# Patient Record
Sex: Female | Born: 1965
Health system: Southern US, Community
[De-identification: ages and names within clinical notes are randomized; demographics above are authoritative.]

## PROBLEM LIST (undated history)

## (undated) DIAGNOSIS — G43909 Migraine, unspecified, not intractable, without status migrainosus: Secondary | ICD-10-CM

## (undated) DIAGNOSIS — E785 Hyperlipidemia, unspecified: Secondary | ICD-10-CM

## (undated) DIAGNOSIS — N6019 Diffuse cystic mastopathy of unspecified breast: Secondary | ICD-10-CM

## (undated) DIAGNOSIS — D649 Anemia, unspecified: Secondary | ICD-10-CM

## (undated) DIAGNOSIS — R110 Nausea: Secondary | ICD-10-CM

## (undated) DIAGNOSIS — H269 Unspecified cataract: Secondary | ICD-10-CM

## (undated) DIAGNOSIS — K219 Gastro-esophageal reflux disease without esophagitis: Secondary | ICD-10-CM

## (undated) DIAGNOSIS — N189 Chronic kidney disease, unspecified: Secondary | ICD-10-CM

## (undated) DIAGNOSIS — M1990 Unspecified osteoarthritis, unspecified site: Secondary | ICD-10-CM

## (undated) DIAGNOSIS — T7840XA Allergy, unspecified, initial encounter: Secondary | ICD-10-CM

## (undated) HISTORY — DX: Hyperlipidemia, unspecified: E78.5

## (undated) HISTORY — DX: Diffuse cystic mastopathy of unspecified breast: N60.19

## (undated) HISTORY — PX: WISDOM TOOTH EXTRACTION: SHX21

## (undated) HISTORY — DX: Anemia, unspecified: D64.9

## (undated) HISTORY — DX: Gastro-esophageal reflux disease without esophagitis: K21.9

## (undated) HISTORY — DX: Migraine, unspecified, not intractable, without status migrainosus: G43.909

## (undated) HISTORY — DX: Unspecified osteoarthritis, unspecified site: M19.90

## (undated) HISTORY — PX: COLONOSCOPY: SHX174

## (undated) HISTORY — DX: Unspecified cataract: H26.9

## (undated) HISTORY — DX: Chronic kidney disease, unspecified: N18.9

## (undated) HISTORY — DX: Allergy, unspecified, initial encounter: T78.40XA

## (undated) HISTORY — DX: Nausea: R11.0

---

## 1990-07-31 HISTORY — PX: TUBAL LIGATION: SHX77

## 1999-07-01 HISTORY — PX: BREAST LUMPECTOMY: SHX2

## 1999-07-08 ENCOUNTER — Ambulatory Visit (HOSPITAL_BASED_OUTPATIENT_CLINIC_OR_DEPARTMENT_OTHER): Admission: RE | Admit: 1999-07-08 | Discharge: 1999-07-08 | Payer: Self-pay | Admitting: *Deleted

## 1999-07-08 ENCOUNTER — Encounter (INDEPENDENT_AMBULATORY_CARE_PROVIDER_SITE_OTHER): Payer: Self-pay | Admitting: *Deleted

## 1999-08-01 HISTORY — PX: BREAST EXCISIONAL BIOPSY: SUR124

## 2001-09-26 ENCOUNTER — Other Ambulatory Visit: Admission: RE | Admit: 2001-09-26 | Discharge: 2001-09-26 | Payer: Self-pay | Admitting: Internal Medicine

## 2001-09-26 ENCOUNTER — Encounter: Payer: Self-pay | Admitting: Internal Medicine

## 2003-05-22 ENCOUNTER — Encounter: Admission: RE | Admit: 2003-05-22 | Discharge: 2003-05-22 | Payer: Self-pay | Admitting: Internal Medicine

## 2003-11-03 ENCOUNTER — Encounter: Admission: RE | Admit: 2003-11-03 | Discharge: 2003-11-03 | Payer: Self-pay | Admitting: Internal Medicine

## 2004-06-17 ENCOUNTER — Encounter: Admission: RE | Admit: 2004-06-17 | Discharge: 2004-06-17 | Payer: Self-pay | Admitting: Internal Medicine

## 2005-03-09 ENCOUNTER — Ambulatory Visit: Payer: Self-pay | Admitting: Internal Medicine

## 2005-03-10 ENCOUNTER — Encounter: Payer: Self-pay | Admitting: Internal Medicine

## 2005-07-10 ENCOUNTER — Ambulatory Visit: Payer: Self-pay | Admitting: Internal Medicine

## 2005-07-10 ENCOUNTER — Other Ambulatory Visit: Admission: RE | Admit: 2005-07-10 | Discharge: 2005-07-10 | Payer: Self-pay | Admitting: Internal Medicine

## 2005-09-04 ENCOUNTER — Encounter: Admission: RE | Admit: 2005-09-04 | Discharge: 2005-09-04 | Payer: Self-pay | Admitting: Internal Medicine

## 2005-09-09 ENCOUNTER — Emergency Department (HOSPITAL_COMMUNITY): Admission: EM | Admit: 2005-09-09 | Discharge: 2005-09-09 | Payer: Self-pay | Admitting: Emergency Medicine

## 2005-10-12 ENCOUNTER — Ambulatory Visit: Payer: Self-pay | Admitting: Internal Medicine

## 2006-08-16 ENCOUNTER — Ambulatory Visit: Payer: Self-pay | Admitting: Internal Medicine

## 2006-08-17 ENCOUNTER — Encounter: Payer: Self-pay | Admitting: Internal Medicine

## 2007-12-16 ENCOUNTER — Ambulatory Visit: Payer: Self-pay | Admitting: Internal Medicine

## 2007-12-16 DIAGNOSIS — J301 Allergic rhinitis due to pollen: Secondary | ICD-10-CM

## 2007-12-16 DIAGNOSIS — D649 Anemia, unspecified: Secondary | ICD-10-CM

## 2007-12-16 DIAGNOSIS — N6019 Diffuse cystic mastopathy of unspecified breast: Secondary | ICD-10-CM

## 2007-12-16 DIAGNOSIS — H9190 Unspecified hearing loss, unspecified ear: Secondary | ICD-10-CM | POA: Insufficient documentation

## 2007-12-16 DIAGNOSIS — K219 Gastro-esophageal reflux disease without esophagitis: Secondary | ICD-10-CM

## 2007-12-17 ENCOUNTER — Encounter: Payer: Self-pay | Admitting: Internal Medicine

## 2007-12-19 ENCOUNTER — Encounter: Payer: Self-pay | Admitting: Internal Medicine

## 2007-12-20 ENCOUNTER — Encounter (INDEPENDENT_AMBULATORY_CARE_PROVIDER_SITE_OTHER): Payer: Self-pay | Admitting: *Deleted

## 2007-12-24 ENCOUNTER — Encounter: Admission: RE | Admit: 2007-12-24 | Discharge: 2007-12-24 | Payer: Self-pay | Admitting: Internal Medicine

## 2007-12-24 ENCOUNTER — Telehealth (INDEPENDENT_AMBULATORY_CARE_PROVIDER_SITE_OTHER): Payer: Self-pay | Admitting: *Deleted

## 2007-12-26 ENCOUNTER — Encounter (INDEPENDENT_AMBULATORY_CARE_PROVIDER_SITE_OTHER): Payer: Self-pay | Admitting: *Deleted

## 2008-01-08 ENCOUNTER — Encounter: Payer: Self-pay | Admitting: Internal Medicine

## 2008-01-24 ENCOUNTER — Telehealth: Payer: Self-pay | Admitting: Family Medicine

## 2008-01-27 ENCOUNTER — Ambulatory Visit: Payer: Self-pay | Admitting: Family Medicine

## 2008-01-27 DIAGNOSIS — R030 Elevated blood-pressure reading, without diagnosis of hypertension: Secondary | ICD-10-CM | POA: Insufficient documentation

## 2008-02-05 ENCOUNTER — Telehealth: Payer: Self-pay | Admitting: Internal Medicine

## 2009-01-04 ENCOUNTER — Ambulatory Visit: Payer: Self-pay | Admitting: Internal Medicine

## 2009-01-04 DIAGNOSIS — E785 Hyperlipidemia, unspecified: Secondary | ICD-10-CM

## 2009-01-05 ENCOUNTER — Encounter: Payer: Self-pay | Admitting: Internal Medicine

## 2009-01-18 ENCOUNTER — Ambulatory Visit: Payer: Self-pay | Admitting: Family Medicine

## 2009-01-18 DIAGNOSIS — M25569 Pain in unspecified knee: Secondary | ICD-10-CM

## 2009-01-18 DIAGNOSIS — M629 Disorder of muscle, unspecified: Secondary | ICD-10-CM

## 2009-03-24 ENCOUNTER — Encounter: Admission: RE | Admit: 2009-03-24 | Discharge: 2009-03-24 | Payer: Self-pay | Admitting: Internal Medicine

## 2009-03-25 DIAGNOSIS — R928 Other abnormal and inconclusive findings on diagnostic imaging of breast: Secondary | ICD-10-CM | POA: Insufficient documentation

## 2009-03-29 ENCOUNTER — Encounter: Admission: RE | Admit: 2009-03-29 | Discharge: 2009-03-29 | Payer: Self-pay | Admitting: Internal Medicine

## 2009-09-13 ENCOUNTER — Ambulatory Visit: Payer: Self-pay | Admitting: Internal Medicine

## 2009-09-13 DIAGNOSIS — N943 Premenstrual tension syndrome: Secondary | ICD-10-CM | POA: Insufficient documentation

## 2009-09-16 ENCOUNTER — Encounter: Payer: Self-pay | Admitting: Internal Medicine

## 2009-12-06 ENCOUNTER — Ambulatory Visit: Payer: Self-pay | Admitting: Internal Medicine

## 2010-01-12 ENCOUNTER — Telehealth: Payer: Self-pay | Admitting: Internal Medicine

## 2010-02-14 ENCOUNTER — Ambulatory Visit: Payer: Self-pay | Admitting: Internal Medicine

## 2010-03-25 ENCOUNTER — Encounter: Admission: RE | Admit: 2010-03-25 | Discharge: 2010-03-25 | Payer: Self-pay | Admitting: Internal Medicine

## 2010-03-25 LAB — HM MAMMOGRAPHY: HM Mammogram: NEGATIVE

## 2010-03-29 ENCOUNTER — Encounter: Payer: Self-pay | Admitting: Internal Medicine

## 2010-05-11 ENCOUNTER — Encounter: Payer: Self-pay | Admitting: Internal Medicine

## 2010-05-11 ENCOUNTER — Ambulatory Visit: Payer: Self-pay | Admitting: Internal Medicine

## 2010-05-11 DIAGNOSIS — R609 Edema, unspecified: Secondary | ICD-10-CM

## 2010-05-11 DIAGNOSIS — R002 Palpitations: Secondary | ICD-10-CM | POA: Insufficient documentation

## 2010-08-22 ENCOUNTER — Encounter: Payer: Self-pay | Admitting: Internal Medicine

## 2010-08-30 NOTE — Assessment & Plan Note (Signed)
Summary: CPX/DLO   Vital Signs:  Patient profile:   45 year old female Height:      59.25 inches Weight:      136 pounds Temp:     98.3 degrees F oral Pulse rate:   72 / minute Pulse rhythm:   regular BP sitting:   120 / 80  (left arm) Cuff size:   regular  Vitals Entered By: Lowella Petties CMA (May 11, 2010 9:30 AM) CC: CPX- no pap   History of Present Illness: Doing well Takes the OCP for 3 cycles and then has fairly normal withdrawl bleeding  Having trouble with fluid retention All over---better after sleep (has nocturia) Notes some easier DOE----then she will have swollen fingers and feet Very careful about salt intake  Has area that is dry and scaly on right thumb Keeps it under control with lotion--but now it has gone down into fingernail    Allergies: 1)  ! Sulfa 2)  ! Penicillin  Past History:  Past medical, surgical, family and social histories (including risk factors) reviewed for relevance to current acute and chronic problems.  Past Medical History: Reviewed history from 01/04/2009 and no changes required. Allergic rhinitis Fibrocystic breasts GERD Migraine headaches Hyperlipidemia  Past Surgical History: Reviewed history from 12/16/2007 and no changes required. VD x 2 1992 Tubal ligation 12/00 Lumpectomy left breast  Family History: Reviewed history from 12/16/2007 and no changes required. Dad with MI, high chol, hearing loss Mom had early menopause Breast cancer in mat GGM Ovarian cancer in Pat aunt Bladder cancer in mat GM No colon cancer  Social History: Reviewed history from 12/16/2007 and no changes required. 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 ENT:  Denies decreased hearing and ringing in ears; teeth are fine--regular with dentist. CV:  Complains of palpitations and  shortness of breath with exertion; denies chest pain or discomfort, difficulty breathing at night, difficulty breathing while lying down, fainting, and lightheadness; Occ flutter in breath. Resp:  Denies cough and shortness of breath; variable sleep weight is fairly stable--she felt it has goine up on the 3 months of straight OCP No sig daytime somnolence wears seat belt. GI:  Complains of indigestion; denies abdominal pain, bloody stools, change in bowel habits, dark tarry stools, nausea, and vomiting; occ still needs extra zantac but heartburn usually controlled. GU:  Complains of nocturia; denies dysuria and incontinence; no sexual problems. MS:  Complains of joint pain; denies joint swelling; uses glucosamine and chondriotin for occ knee and elbow pain. Derm:  See HPI; Complains of rash; denies lesion(s). Neuro:  Complains of headaches; did have one migraine recently with vision changes and nausea---took advil and went to bed. Psych:  Denies anxiety and depression. Heme:  Denies abnormal bruising and enlarge lymph nodes. Allergy:  Complains of seasonal allergies and sneezing; zyrtec works well.  Physical Exam  General:  alert and normal appearance.   Eyes:  pupils equal, pupils round, pupils reactive to light, and no optic disk abnormalities.   ?early cataract on right Ears:  R ear normal and L ear normal.   Mouth:  no erythema, no exudates, and no lesions.   Neck:  supple, no masses, and no thyromegaly.   Breasts:  no abnormal thickening, no tenderness, and no adenopathy.  Bilateral cystic changes Lungs:  normal respiratory effort, no intercostal retractions, no accessory muscle use, and  normal breath sounds.   Heart:  normal rate, regular rhythm, no murmur, and no gallop.   Abdomen:  soft, non-tender, and no masses.   Msk:  no joint tenderness and no joint swelling.   Pulses:  1+ in feet Extremities:  no edema Neurologic:  alert & oriented X3, strength normal in all extremities,  and gait normal.   Skin:  no suspicious lesions and no ulcerations.   Very slight linear thickening on right thumb Psych:  normally interactive, good eye contact, not anxious appearing, and not depressed appearing.     Impression & Recommendations:  Problem # 1:  PREVENTIVE HEALTH CARE (ICD-V70.0) Assessment Comment Only Up to date with mammo discussed fitness  Problem # 2:  EDEMA- LOCALIZED (ICD-782.3) Assessment: New  may be related to her hormonal therapy will try having withdrawl monthly check labs if edema persists, could try low dose furosemide as needed   Orders: TLB-Renal Function Panel (80069-RENAL) TLB-CBC Platelet - w/Differential (85025-CBCD) TLB-TSH (Thyroid Stimulating Hormone) (84443-TSH) TLB-Hepatic/Liver Function Pnl (80076-HEPATIC) Venipuncture (53664)  Problem # 3:  PALPITATIONS (ICD-785.1) Assessment: Comment Only  nothing worrisome on exam or EKG  Orders: EKG w/ Interpretation (93000)  Problem # 4:  PREMENSTRUAL DYSPHORIC SYNDROME (ICD-625.4) Assessment: Improved will continue OCP but go back to monthly  Her updated medication list for this problem includes:    Advil 200 Mg Caps (Ibuprofen) .Marland Kitchen... Take 1 by mouth once daily  Complete Medication List: 1)  Omeprazole 20 Mg Tbec (Omeprazole) .... Take 1 daily about 30 minutes before breakfast 2)  Zyrtec Allergy 10 Mg Tabs (Cetirizine hcl) .... Take 1 by mouth once daily 3)  Advil 200 Mg Caps (Ibuprofen) .... Take 1 by mouth once daily 4)  Ogestrel 0.5-50 Mg-mcg Tabs (Norgestrel-ethinyl estradiol) .... Take as directed on package for 21days  Patient Instructions: 1)  Please schedule a follow-up appointment in 1 year.   Prior Medications (reviewed today): OMEPRAZOLE 20 MG  TBEC (OMEPRAZOLE) Take 1 daily about 30 minutes before breakfast ZYRTEC ALLERGY 10 MG TABS (CETIRIZINE HCL) take 1 by mouth once daily ADVIL 200 MG CAPS (IBUPROFEN) take 1 by mouth once daily OGESTREL 0.5-50 MG-MCG TABS  (NORGESTREL-ETHINYL ESTRADIOL) Take as directed on package for 21days Current Allergies: ! SULFA ! PENICILLIN  Appended Document: CPX/DLO

## 2010-08-30 NOTE — Assessment & Plan Note (Signed)
Summary: 3 M F/U DLO   Vital Signs:  Patient profile:   45 year old female Weight:      132 pounds Temp:     98.7 degrees F oral BP sitting:   110 / 70  (left arm) Cuff size:   regular  Vitals Entered By: Mervin Hack CMA Duncan Dull) (Dec 06, 2009 4:18 PM) CC: 3 month follow-up   History of Present Illness: Mood is better on higher dose OCP Bleeding is heavier though--but fortunately only when expected Goes heavy for 48 hours May need  7 pads each day for those days No sig pain  Still gets irritated at times Tends to be most impatient in the week before her cycle Much better though than before  Allergies: 1)  ! Sulfa 2)  ! Penicillin  Past History:  Past medical, surgical, family and social histories (including risk factors) reviewed for relevance to current acute and chronic problems.  Past Medical History: Reviewed history from 01/04/2009 and no changes required. Allergic rhinitis Fibrocystic breasts GERD Migraine headaches Hyperlipidemia  Past Surgical History: Reviewed history from 12/16/2007 and no changes required. VD x 2 1992 Tubal ligation 12/00 Lumpectomy left breast  Family History: Reviewed history from 12/16/2007 and no changes required. Dad with MI, high chol, hearing loss Mom had early menopause Breast cancer in mat GGM Ovarian cancer in Pat aunt Bladder cancer in mat GM No colon cancer  Social History: Reviewed history from 12/16/2007 and no changes required. 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   Psych:  normally interactive, good eye contact, not anxious appearing, and not depressed  appearing.     Impression & Recommendations:  Problem # 1:  PREMENSTRUAL DYSPHORIC SYNDROME (ICD-625.4) Assessment Improved definitely better on the higher dose OCP discussed plans for continuing and how we would wean when she reaches 49 (or try off) counselled all of 15 minute visit  Her updated medication list for this problem includes:    Advil 200 Mg Caps (Ibuprofen) .Marland Kitchen... Take 1 by mouth once daily  Complete Medication List: 1)  Omeprazole 20 Mg Tbec (Omeprazole) .... Take 1 daily about 30 minutes before breakfast 2)  Zyrtec Allergy 10 Mg Tabs (Cetirizine hcl) .... Take 1 by mouth once daily 3)  Advil 200 Mg Caps (Ibuprofen) .... Take 1 by mouth once daily 4)  Ogestrel 0.5-50 Mg-mcg Tabs (Norgestrel-ethinyl estradiol) .... Take as directed on package  Patient Instructions: 1)  Please schedule a follow-up appointment in 3-6  months  for physical Prescriptions: OGESTREL 0.5-50 MG-MCG TABS (NORGESTREL-ETHINYL ESTRADIOL) Take as directed on package  #1pack x 12   Entered by:   Mervin Hack CMA (AAMA)   Authorized by:   Cindee Salt MD   Signed by:   Mervin Hack CMA (AAMA) on 12/06/2009   Method used:   [facility] to        [id number]       [address]       [place], [place]  [address]       Ph: [phone] or [phone]  Fax: [phone]   RxID:   [id number]   Current Allergies (reviewed today): ! SULFA ! PENICILLIN

## 2010-08-30 NOTE — Assessment & Plan Note (Signed)
Summary: DISCUSS MENOPAUSE   Vital Signs:  Patient profile:   45 year old female Height:      60 inches Weight:      136.25 pounds BMI:     26.71 Temp:     98.4 degrees F oral Pulse rate:   80 / minute Pulse rhythm:   regular BP sitting:   112 / 80  (left arm) Cuff size:   regular  Vitals Entered By: Linde Gillis CMA Duncan Dull) (September 13, 2009 2:42 PM) CC: discuss birthcontrol   History of Present Illness: thinks she is having menopause problems Notes some blood when wiping after voiding--generally the week before her cycle is due No dysuria No hematuria Does note urgency because she is drinking lots of fluid  also very irritable Trouble thinking and getting words out right Memory problems Headaches are recurring "No energy"  Feels flat No highs, "flies off the handle at nothing" Had mood issues in past which improved with OCP--but now worsening She feels like the week before her period all the time  Not depressed Son just left for boot camp--that is not a bad thing  Happy with BP now If she exercises regularly, it stays down  has started calcium and vitamin D concerned about her bones and neck thinks she may have thoracic scoliosis  Allergies: 1)  ! Sulfa 2)  ! Penicillin  Past History:  Past medical, surgical, family and social histories (including risk factors) reviewed for relevance to current acute and chronic problems.  Past Medical History: Reviewed history from 01/04/2009 and no changes required. Allergic rhinitis Fibrocystic breasts GERD Migraine headaches Hyperlipidemia  Past Surgical History: Reviewed history from 12/16/2007 and no changes required. VD x 2 1992 Tubal ligation 12/00 Lumpectomy left breast  Family History: Reviewed history from 12/16/2007 and no changes required. Dad with MI, high chol, hearing loss Mom had early menopause Breast cancer in mat GGM Ovarian cancer in Pat aunt Bladder cancer in mat GM No colon  cancer  Social History: Reviewed history from 12/16/2007 and no changes required. 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   Genitalia:  normal introitus, no external lesions, no vaginal discharge, and no vaginal or cervical lesions.   Cervix is mildly friable but normal to palpation Bimanual exam is normal Msk:  no sig kyphosis or scoliosis   Impression & Recommendations:  Problem # 1:  PREMENSTRUAL DYSPHORIC SYNDROME (ICD-625.4) Assessment New multiple symptoms now with some spotting (when wipes) in week before period despite the OCP mood has been a major problem---she feels that she has waited long enough (over 2 months) and is worsening and would like Rx discussed options--- ?fluoxetine will try increased estadiol dose in OCP instead since she has the bleeding as well as mood issues  will recheck in 2 months  Her updated medication list for this problem includes:    Advil 200 Mg Caps (Ibuprofen) .Marland Kitchen... Take 1 by mouth once daily  Complete Medication List: 1)  Omeprazole 20 Mg Tbec (Omeprazole) .... Take 1 daily about 30 minutes before breakfast 2)  Zyrtec Allergy 10 Mg Tabs (Cetirizine hcl) .... Take 1 by mouth once daily 3)  Sudafed 12 Hour 120 Mg Xr12h-tab (Pseudoephedrine hcl) .... Take 1 by mouth once daily 4)  Advil 200 Mg  Caps (Ibuprofen) .... Take 1 by mouth once daily 5)  Ogestrel 0.5-50 Mg-mcg Tabs (Norgestrel-ethinyl estradiol) .... Take as directed on package  Patient Instructions: 1)  Please schedule a follow-up appointment in 2-3  months.  Prescriptions: OGESTREL 0.5-50 MG-MCG TABS (NORGESTREL-ETHINYL ESTRADIOL) Take as directed on package  #1 x 5   Entered and Authorized by:   Cindee Salt MD   Signed by:   Cindee Salt  MD on 09/13/2009   Method used:   [facility]        [id number]       [address]       [place], [place]  [address]       Ph: [phone] or [phone]       Fax: [phone]   RxID:   [id number]   Current Allergies (reviewed today): ! SULFA ! PENICILLIN

## 2010-08-30 NOTE — Assessment & Plan Note (Signed)
Summary: congestion, stuffy nose/DLO   Vital Signs:  Patient profile:   45 year old female Weight:      132 pounds Temp:     98.1 degrees F oral Pulse rate:   84 / minute Pulse rhythm:   regular Resp:     16 per minute BP sitting:   140 / 92  (left arm) Cuff size:   regular  Vitals Entered By: Lowella Petties CMA (February 14, 2010 3:38 PM) CC: Sinus congestion, drainage, sore throat   History of Present Illness: Has had congestion and sore thorat Rhinorrhea and stuffy nose Chest hurts with cough Only occ cough but now getting tighter Cough is loose but not expectorating has ear pressure  started 1 week ago and has been progressively worsening mucinex D not clearly helping has kept with her allergy meds  No fever No SOB  Allergies: 1)  ! Sulfa 2)  ! Penicillin  Past History:  Past medical, surgical, family and social histories (including risk factors) reviewed for relevance to current acute and chronic problems.  Past Medical History: Reviewed history from 01/04/2009 and no changes required. Allergic rhinitis Fibrocystic breasts GERD Migraine headaches Hyperlipidemia  Past Surgical History: Reviewed history from 12/16/2007 and no changes required. VD x 2 1992 Tubal ligation 12/00 Lumpectomy left breast  Family History: Reviewed history from 12/16/2007 and no changes required. Dad with MI, high chol, hearing loss Mom had early menopause Breast cancer in mat GGM Ovarian cancer in Pat aunt Bladder cancer in mat GM No colon cancer  Social History: Reviewed history from 12/16/2007 and no changes required. 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  NAD Head:  mild frontal tenderness but no maxillary tenderness Ears:  R ear normal and L ear normal.   Nose:  marked  inflammation with mild swelling Mouth:  no erythema and no exudates.   Neck:  supple, no masses, and no cervical lymphadenopathy.   Lungs:  normal respiratory effort, no intercostal retractions, no accessory muscle use, normal breath sounds, no crackles, and no wheezes.     Impression & Recommendations:  Problem # 1:  SINUSITIS - ACUTE-NOS (ICD-461.9) Assessment New  seems to now have secondary bacterial infection in view of recent worsening will use anlagesics and amoxil  Her updated medication list for this problem includes:    Amoxicillin 500 Mg Tabs (Amoxicillin) .Marland Kitchen... 2 tabs by mouth two times a day for sinus infection  Complete Medication List: 1)  Omeprazole 20 Mg Tbec (Omeprazole) .... Take 1 daily about 30 minutes before breakfast 2)  Zyrtec Allergy 10 Mg Tabs (Cetirizine hcl) .... Take 1 by mouth once daily 3)  Advil 200 Mg Caps (Ibuprofen) .... Take 1 by mouth once daily 4)  Ogestrel 0.5-50 Mg-mcg Tabs (Norgestrel-ethinyl estradiol) .... Take as directed on package for 21days 5)  Amoxicillin 500 Mg Tabs (Amoxicillin) .... 2 tabs by mouth two times a day for sinus infection  Patient Instructions: 1)  Please schedule a follow-up appointment as needed .  2)  Call later this week if not feeling better 3)  Please keep appt for physical in October Prescriptions: AMOXICILLIN 500 MG TABS (AMOXICILLIN) 2 tabs by mouth two times a day for sinus infection  #40 x 0   Entered and Authorized by:   Ronnette Hila  Alphonsus Sias MD   Signed by:   Cindee Salt MD on 02/14/2010   Method used:   [facility]        [id number]       [address]       [place], [place]  [address]       Ph: [phone] or [phone]       Fax: [phone]   RxID:   [id number]   Prior Medications (reviewed today): OMEPRAZOLE 20 MG  TBEC (OMEPRAZOLE) Take 1 daily about 30 minutes before breakfast ZYRTEC ALLERGY 10 MG TABS (CETIRIZINE HCL) take 1 by mouth  once daily ADVIL 200 MG CAPS (IBUPROFEN) take 1 by mouth once daily OGESTREL 0.5-50 MG-MCG TABS (NORGESTREL-ETHINYL ESTRADIOL) Take as directed on package for 21days Current Allergies: ! SULFA ! PENICILLIN

## 2010-08-30 NOTE — Progress Notes (Signed)
Summary: wants to change pills  Phone Note Call from Patient Call back at Work Phone [phone]   Caller: Patient Call For: Cindee Salt MD Summary of Call: Pt wants to have her ogestrel script changed from 28 days to 21 days so that her insurance will cover. She doesnt want to have periods, wants to take the pills continuously.   [facility] [place]. Initial call taken by: Lowella Petties CMA,  January 12, 2010 12:13 PM  Follow-up for Phone Call        I have never heard of continuously taking a birth control pill and never allowing the period. There is a product that has 3 months at a time and occ --like for a vacatiion-- women will skip the last week and go on to the next month without a withdrawl. This would be okay but no more than 3 months at a time Cindee Salt MD  January 12, 2010 1:28 PM   Additional Follow-up for Phone Call Additional follow up Details #1::        Rx Called In, left message at work number for patient that rx was called in and what Dr. Alphonsus Sias suggested. Additional Follow-up by: Mervin Hack CMA Duncan Dull),  January 12, 2010 3:42 PM    New/Updated Medications: OGESTREL 0.5-50 MG-MCG TABS (NORGESTREL-ETHINYL ESTRADIOL) Take as directed on package for 21days Prescriptions: OGESTREL 0.5-50 MG-MCG TABS (NORGESTREL-ETHINYL ESTRADIOL) Take as directed on package for 21days  #3 packs x 3   Entered by:   Mervin Hack CMA (AAMA)   Authorized by:   Cindee Salt MD   Signed by:   Mervin Hack CMA (AAMA) on 01/12/2010   Method used:   [facility]        [id number]       [address]       [place], [place]  [address]       Ph: [phone] or [phone]       Fax: [phone]   RxID:   [id number]

## 2010-08-30 NOTE — Letter (Signed)
Summary: Results Follow up Letter  [place] [facility]  [address] [place], [place] [address]   Phone: [phone]  Fax: [phone]    03/29/2010 MRN: [id number]  [facility] [address] [place], [place]  [address]  Dear Ms. Minnie,  The following are the results of your recent test(s):  Test         Result    Pap Smear:        Normal _____  Not Normal _____ Comments: ______________________________________________________ Cholesterol: LDL(Bad cholesterol):         Your goal is less than:         HDL (Good cholesterol):       Your goal is more than: Comments:  ______________________________________________________ Mammogram:        Normal __X___  Not Normal _____ Comments: mammo is normal, repeat recommended in 1 year or so  ___________________________________________________________________ Hemoccult:        Normal _____  Not normal _______ Comments:    _____________________________________________________________________ Other Tests:    We routinely do not discuss normal results over the telephone.  If you desire a copy of the results, or you have any questions about this information we can discuss them at your next office visit.   Sincerely,     Tillman Abide, MD

## 2010-08-30 NOTE — Letter (Signed)
Summary: Results Follow up Letter  [place] [facility]  [address] [place], [place] [address]   Phone: [phone]  Fax: [phone]    09/16/2009 MRN: [id number]  [facility] [address] [place], [place]  [address]  Dear Samantha Howell,  The following are the results of your recent test(s):  Test         Result    Pap Smear:        Normal __X___  Not Normal _____ Comments: ______________________________________________________ Cholesterol: LDL(Bad cholesterol):         Your goal is less than:         HDL (Good cholesterol):       Your goal is more than: Comments:  ______________________________________________________ Mammogram:        Normal _____  Not Normal _____ Comments:  ___________________________________________________________________ Hemoccult:        Normal _____  Not normal _______ Comments:    _____________________________________________________________________ Other Tests:    We routinely do not discuss normal results over the telephone.  If you desire a copy of the results, or you have any questions about this information we can discuss them at your next office visit.   Sincerely,      Tillman Abide, MD

## 2010-10-10 ENCOUNTER — Encounter: Payer: Self-pay | Admitting: Family Medicine

## 2010-10-10 ENCOUNTER — Ambulatory Visit (INDEPENDENT_AMBULATORY_CARE_PROVIDER_SITE_OTHER): Payer: Self-pay | Admitting: Family Medicine

## 2010-10-10 DIAGNOSIS — R51 Headache: Secondary | ICD-10-CM

## 2010-10-10 DIAGNOSIS — R519 Headache, unspecified: Secondary | ICD-10-CM | POA: Insufficient documentation

## 2010-10-18 NOTE — Assessment & Plan Note (Signed)
Summary: LUMP ON BACK OF HEAD/CLE   [employer]   Vital Signs:  Patient profile:   45 year old female Weight:      138.75 pounds BMI:     27.89 Temp:     98.5 degrees F oral Pulse rate:   84 / minute Pulse rhythm:   regular BP sitting:   124 / 78  (left arm) Cuff size:   regular  Vitals Entered By: Selena Batten Dance CMA Duncan Dull) (October 10, 2010 9:06 AM) CC: Check lump on head   History of Present Illness: CC: pain back of head  1d h/o pain in back of head.  hurts -burning, pain, stinging.  woke her up saturday morning.  nothing on skin that they could see (pt and husband).  hasn't been outside since came home from [place].  denies trauma/injury to head.  pain present more with touch.  nothing like this prior.  advil is not touching this pain.  allergy pill and pseudophed not helping.  feeling more congested than normal.  mother with shingles years ago.  pt with h/o chicken pox as child.    requests bc refill.  Current Medications (verified): 1)  Omeprazole 20 Mg  Tbec (Omeprazole) .... Take 1 Daily About 30 Minutes Before Breakfast 2)  Zyrtec Allergy 10 Mg Tabs (Cetirizine Hcl) .... Take 1 By Mouth Once Daily 3)  Advil 200 Mg Caps (Ibuprofen) .... Take 1 By Mouth Once Daily 4)  Ogestrel 0.5-50 Mg-Mcg Tabs (Norgestrel-Ethinyl Estradiol) .... Take As Directed On Package For 21days  Allergies: 1)  ! Sulfa 2)  ! 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   Head:  right occipital skull tender to palpation, no erythema, warmth, swelling.  nontender sinuses Eyes:  PERRLA, EOMI, no injection Ears:  R ear normal and L ear  normal.  canals normal without rash Nose:  nares clear Mouth:  no erythema, no exudates, and no lesions.   Neck:  supple, no masses, and no thyromegaly.  no LAD Lungs:  normal respiratory effort, no intercostal retractions, no accessory muscle use, and normal breath sounds.   Heart:  normal rate, regular rhythm, no murmur, and no gallop.   Pulses:  2+ rad pulses, brisk cap refill Extremities:  no pedal edema Skin:  midline occipital region erythematous skin lesion ? stork bite, no rash over area of tenderness   Impression & Recommendations:  Problem # 1:  CEPHALGIA (ICD-784.0) exam normal except for midline skin lesion, ? nevus simplex (stork bite).  pt unsure if has had there prior.   no erythematous vesicles but pain sounds prodromal to shingles. advised to watch for rash, if presents, start valtrex.   start gabapentin for presumed neuropathic pain, discussed side effect of dizziness/somnolence.   discussed to watch for any rash on face around eyes, if occurs return sooner.   discussed to watch for any lesions in ear canal or any facial paralysis, if occurs to return sooner.  doubt migraines or other cause of cephalgia.  Her updated medication list for this problem includes:    Advil 200 Mg Caps (Ibuprofen) .Marland Kitchen... Take 1 by mouth once daily  Complete Medication List: 1)  Omeprazole  20 Mg Tbec (Omeprazole) .... Take 1 daily about 30 minutes before breakfast 2)  Zyrtec Allergy 10 Mg Tabs (Cetirizine hcl) .... Take 1 by mouth once daily 3)  Advil 200 Mg Caps (Ibuprofen) .... Take 1 by mouth once daily 4)  Ogestrel 0.5-50 Mg-mcg Tabs (Norgestrel-ethinyl estradiol) .... Take as directed on package for 21days 5)  Valacyclovir Hcl 1 Gm Tabs (Valacyclovir hcl) .... Three times a day x 7 days 6)  Gabapentin 300 Mg Caps (Gabapentin) .... One nightly, may go up to one two times a day, take for 2 wks  Patient Instructions: 1)  This could be shingles.  Use antiviral valcyclovir if you notice  rash starting. 2)  For pain - start gabapentin (nerve pain medicine).  one nightly, may go up to one twice daily after a few days. 3)  continue advil. 4)  If any rash around the eyes, or inside the ear canal, please return to be seen. 5)  If not improving as expected, please return to be seen. Prescriptions: GABAPENTIN 300 MG CAPS (GABAPENTIN) one nightly, may go up to one two times a day, take for 2 wks  #45 x 0   Entered and Authorized by:   Eustaquio Boyden  MD   Signed by:   Eustaquio Boyden  MD on 10/10/2010   Method used:   Electronically to        [id number] * (retail)       [address]       [place], [place]  [address]       Ph: [phone] or [phone]       Fax: [phone]   RxID:   [id number] OGESTREL 0.5-50 MG-MCG TABS (NORGESTREL-ETHINYL ESTRADIOL) Take as directed on package for 21days  #3 packs x 1   Entered and Authorized by:   Eustaquio Boyden  MD   Signed by:   Eustaquio Boyden  MD on 10/10/2010   Method used:   Electronically to        [id number] (retail)       [address]       [place], [place]  [address]       Ph: [phone] or [phone]       Fax: [phone]   RxID:   [id number] VALACYCLOVIR HCL 1 GM TABS (VALACYCLOVIR HCL) three times a day x 7 days  #21 x 0   Entered and Authorized by:   Eustaquio Boyden  MD   Signed by:   Eustaquio Boyden  MD on 10/10/2010   Method used:   Print then Give to Patient   RxID:   [id number]    Orders Added: 1)  Est. Patient Level III [69678]    Current Allergies (reviewed today): ! SULFA ! PENICILLIN

## 2010-12-16 NOTE — Op Note (Signed)
[place]  Patient:    Samantha Howell                       MRN: [id number] Proc. Date: 07/08/99 Adm. Date:  [id number] Attending:  Stephenie Acres                           Operative Report  PREOPERATIVE DIAGNOSIS:  Left breast mass.  POSTOPERATIVE DIAGNOSIS:  Left breast mass.  PROCEDURE:  Excisional left breast biopsy.  SURGEON:  Catalina Lunger, M.D.  ANESTHESIA:  Local MAC.  DESCRIPTION OF PROCEDURE:  The patient was taken to the operating room and placed in a supine position.  After adequate anesthesia was induced using MAC technique, the left breast was prepped and draped in a normal sterile fashion.  Using 1% lidocaine local anesthesia, the skin overlying the mass in the two oclock position in the left breast was anesthetized.  A curvilinear incision was made and dissected down until a firm fibrotic area of tissue.  This was excised in its entirety. Posterior to this, lay a more rounded mass which I attempted to grasp with a 2-0 Vicryl suture, and a large amount of greenish fluid was removed.  After this was completely obliterated, no mass was left.  Adequate hemostasis was ensured.  The skin was closed with a running subcuticular 4-0 Monocryl. Steri-Strips and a sterile dressing was applied. 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

## 2011-02-20 ENCOUNTER — Encounter: Payer: Self-pay | Admitting: Internal Medicine

## 2011-02-20 ENCOUNTER — Encounter: Payer: Self-pay | Admitting: Family Medicine

## 2011-02-20 ENCOUNTER — Ambulatory Visit (INDEPENDENT_AMBULATORY_CARE_PROVIDER_SITE_OTHER): Payer: BC Managed Care – PPO | Admitting: Family Medicine

## 2011-02-20 ENCOUNTER — Telehealth: Payer: Self-pay | Admitting: Family Medicine

## 2011-02-20 ENCOUNTER — Ambulatory Visit
Admission: RE | Admit: 2011-02-20 | Discharge: 2011-02-20 | Disposition: A | Discharge status: Home / Self Care | Payer: BC Managed Care – PPO | Source: Ambulatory Visit | Attending: Family Medicine | Admitting: Family Medicine

## 2011-02-20 VITALS — BP 150/100 | HR 80 | Temp 98.4°F | Wt 137.5 lb

## 2011-02-20 DIAGNOSIS — R1011 Right upper quadrant pain: Secondary | ICD-10-CM

## 2011-02-20 LAB — CBC WITH DIFFERENTIAL/PLATELET
Eosinophils Relative: 3.2 % (ref 0.0–5.0)
Lymphocytes Relative: 31.4 % (ref 12.0–46.0)
Lymphs Abs: 1.9 10*3/uL (ref 0.7–4.0)
MCV: 87 fl (ref 78.0–100.0)
Monocytes Absolute: 0.5 10*3/uL (ref 0.1–1.0)
Neutro Abs: 3.3 10*3/uL (ref 1.4–7.7)
Neutrophils Relative %: 56.1 % (ref 43.0–77.0)
Platelets: 292 10*3/uL (ref 150.0–400.0)
RBC: 4.92 Mil/uL (ref 3.87–5.11)

## 2011-02-20 LAB — COMPREHENSIVE METABOLIC PANEL
Alkaline Phosphatase: 41 U/L (ref 39–117)
BUN: 12 mg/dL (ref 6–23)
Chloride: 100 mEq/L (ref 96–112)
Potassium: 4 mEq/L (ref 3.5–5.1)
Total Bilirubin: 0.4 mg/dL (ref 0.3–1.2)
Total Protein: 7.7 g/dL (ref 6.0–8.3)

## 2011-02-20 NOTE — Assessment & Plan Note (Signed)
sxs pretty typical of biliary colic, first episode. Check blood work, set up with abd [place].   If gallstones or sludge, referral to surgery.  If negative [place], further image with CT vs HIDA scan.

## 2011-02-20 NOTE — Progress Notes (Signed)
  Subjective:    Patient ID: Samantha Howell, female    DOB: 08/16/1965, 45 y.o.   MRN: [id number]  HPI CC: abd pain  Woke up at 5:30am with abd pain.  Thought it was gas, very bloated.  Made her double over.  Pain entire abd, then as started to settle down started hurting RUQ.  Also pain through back.  Severe pain lasted 3 1/2hrs.  Now much better, still sore in abd.  At its worse 10+/10, currently 0/10, intermittent.  Pain described as cramping, mild stabbing, bloating.  No vomiting, mild nausea, no fevers/chills, diarrhea, dysuria, blood in stool or urine, no BM changes.    Yesterday had 2 cheeseburgers from [employer] - very different from her, as well as [place] spicy and deep fried corn tortillas.  Never had anything like this before.  Took 2 gas-x, didn't help.  Started running recently.  H/o GERD on prilosec.  Endorses sometimes getting nausea after eating.  Requests we call her at cell phone with results.  Review of Systems Per HPI    Objective:   Physical Exam  Nursing note and vitals reviewed. Constitutional: She appears well-developed and well-nourished. No distress.  HENT:  Head: Normocephalic and atraumatic.  Mouth/Throat: Oropharynx is clear and moist. No oropharyngeal exudate.  Eyes: Pupils are equal, round, and reactive to light.  Neck: Normal range of motion. Neck supple.  Cardiovascular: Normal rate, regular rhythm, normal heart sounds and intact distal pulses.   No murmur heard. Pulmonary/Chest: Effort normal and breath sounds normal. No respiratory distress. She has no wheezes. She has no rales.  Abdominal: Soft. Bowel sounds are normal. She exhibits no distension and no mass. There is tenderness (minimal RUQ, neg murphy sign). There is no rebound and no guarding.  Musculoskeletal: She exhibits no edema.  Skin: Skin is warm and dry. No rash noted.  Psychiatric: She has a normal mood and affect.          Assessment & Plan:

## 2011-02-20 NOTE — Patient Instructions (Addendum)
Sounds like gallstones. Blood work today. Pass by Marion's office to schedule ultrasound of abdomen (today if possible). Stay away from greasy/fried foods in meantime.  If pain returns, use ibuprofen 400-600mg  to help ease pain. If worsening pain despite this, fever, or vomiting, please go to ER. Cholelithiasis, Gallbladder Disease (Gallstones) Gallstones are a form of gallbladder disease. When there is an infection of the gallbladder it is called cholecystitis. It is usually caused by a build-up of stones (gallstones or cholelithiasis) in your gallbladder. The gallbladder is not an essential organ. This means it is not necessary for life. It is located slightly to the right of center in the belly (abdomen), behind the liver. It stores bile made in the liver. Bile aids in digestion and absorption of fats. Gallbladder disease may result in feeling sick to your stomach (nausea), abdominal pain, and jaundice. In severe cases, emergency surgery may be required. Gallstones are the most common type of gallbladder disease. They begin as small crystals and slowly grow into stones. Gallstone pain occurs when the gallbladder spasms and a gallstone is blocking the duct. Pain can also occur when a stone passes out of the duct. The pain usually begins suddenly. It may persist from several minutes to several hours. Infection can occur. Infection can add to discomfort and severity of an acute attack. The pain may be made worse by breathing deeply or by being jarred. There may be fever and tenderness to the touch. In some cases, when gallstones do not move into the bile duct, people have no pain or symptoms. These are called "silent" gallstones. Women are three times more likely to develop gallstones than men. Women who have had several pregnancies are more likely to have gallbladder disease. Physicians sometimes advise removing diseased gallbladders before future pregnancies. Other factors that increase the risk of  gallbladder disease are obesity, diets heavy in fried foods and dairy products, increasing age, prolonged use of medications containing female hormones, and heredity. HOME CARE INSTRUCTIONS  Only take over-the-counter or prescription medicines for pain, discomfort, or fever as directed by your caregiver.   Follow a low fat diet until seen again. (Fat causes the gallbladder to contract.)   Follow-up as instructed. Attacks are almost always recurrent and surgery is usually required for permanent treatment.  SEEK IMMEDIATE MEDICAL CARE IF:  Pain is increasing and is not controlled by medications.   You have an oral temperature above 101, not controlled by medication.   You develop nausea and vomiting.  MAKE SURE YOU:   Understand these instructions.   Will watch your condition.   Will get help right away if you are not doing well or get worse.  Document Released: 07/13/2005 Document Re-Released: 06/29/2008 [facility] Patient Information 2011 [place], [place].

## 2011-02-20 NOTE — Telephone Encounter (Signed)
Called pt cell and discussed results of blood work and [place]. [place] - no gallstones, no gallbladder distension or inflammation.  Normal liver.  Overall normal. labwork - normal CBC, lipase, kidneys.  AST 44, ALT 60s.  Some liver irritation. In setting of normal [place], discussed option of CT scan vs referral to GI vs rpt [place] vs watchful waiting.   Pt opts for watchful waiting.  Will call us if rpt attack for CT scan and GI referral.  Please call and set up with rpt LFTs in 3 mo.

## 2011-02-21 ENCOUNTER — Other Ambulatory Visit: Payer: Self-pay | Admitting: *Deleted

## 2011-02-21 MED ORDER — NORGESTREL-ETHINYL ESTRADIOL 0.5-50 MG-MCG PO TABS: 1.0000 | ORAL_TABLET | Freq: Every day | ORAL | Status: DC

## 2011-02-21 NOTE — Telephone Encounter (Signed)
Message left for patient to return my call to schedule 3 month labs.

## 2011-02-21 NOTE — Telephone Encounter (Signed)
Patient returned call, 3 month labs appt scheduled.

## 2011-02-24 ENCOUNTER — Telehealth: Payer: Self-pay | Admitting: *Deleted

## 2011-02-24 DIAGNOSIS — R1011 Right upper quadrant pain: Secondary | ICD-10-CM

## 2011-02-24 NOTE — Telephone Encounter (Signed)
She says that she was told to wait a couple of months, but she feels like she should go ahead and have this done.

## 2011-02-24 NOTE — Telephone Encounter (Signed)
Patient says that she is still having a lot of pain and discomfort and is asking if she can go ahead and go for her ct.

## 2011-02-24 NOTE — Telephone Encounter (Addendum)
I told her to notify us if worsening.  Seems like sxs have not improved. Still feeling some abd discomfort/twinges occasionally, ie last night kept her awake for some of the night. Will ask to set up with GI evaluation. If unable to get her in in next 1-2 wks, let me know as I'll probably get imaging done.

## 2011-02-27 ENCOUNTER — Encounter: Payer: Self-pay | Admitting: Gastroenterology

## 2011-02-27 NOTE — Telephone Encounter (Signed)
GI appt with Dr Arlyce Dice on 03/03/2011, patient has been notified.

## 2011-03-03 ENCOUNTER — Encounter: Payer: Self-pay | Admitting: Gastroenterology

## 2011-03-03 ENCOUNTER — Ambulatory Visit (INDEPENDENT_AMBULATORY_CARE_PROVIDER_SITE_OTHER): Payer: BC Managed Care – PPO | Admitting: Gastroenterology

## 2011-03-03 ENCOUNTER — Other Ambulatory Visit: Payer: BC Managed Care – PPO

## 2011-03-03 DIAGNOSIS — R1011 Right upper quadrant pain: Secondary | ICD-10-CM

## 2011-03-03 DIAGNOSIS — R109 Unspecified abdominal pain: Secondary | ICD-10-CM

## 2011-03-03 LAB — HEPATIC FUNCTION PANEL
ALT: 24 U/L (ref 0–35)
AST: 19 U/L (ref 0–37)
Albumin: 3.8 g/dL (ref 3.5–5.2)
Alkaline Phosphatase: 35 U/L — ABNORMAL LOW (ref 39–117)
Total Protein: 6.5 g/dL (ref 6.0–8.3)

## 2011-03-03 MED ORDER — HYOSCYAMINE SULFATE 0.125 MG SL SUBL: 0.2500 mg | SUBLINGUAL_TABLET | SUBLINGUAL | Status: AC | PRN

## 2011-03-03 NOTE — Patient Instructions (Signed)
Your HIDA scan is scheduled on 03/22/2011 at 9am at [facility] radiology You will need to arrive 15 minutes prior to your appointment Nothing to eat or drink after midnight No stomach medications the day of your test

## 2011-03-03 NOTE — Progress Notes (Signed)
History of Present Illness:  Samantha Howell is a pleasant 45 year old white female referred at the request of Dr.Guiterrez for evaluation of abdominal pain. Approximately 3 weeks ago she awakened with severe upper abdominal pain. Pain radiated across her upper abdomento the back. It lasted approximately 4 hours and was described as  incapacitating. It resolved spontaneously. She underwent an abdominal ultrasound which was unremarkable. LFTs were pertinent for an AST and ALT of 60 and 45 respectively. Subsequent to this time she's had minimal episodes of discomfort in the right upper quadrant. She has awakened with very mild pain on one other occasion. She complains of a sense of fullness in her upper abdomen and chest. She's also developed excessive belching. She denies pyrosis. She is on omeprazole regularly. There is no history of nausea or prior ulcer disease.  She takes NSAIDs regularly for headaches.   Review of Systems: Pertinent positive and negative review of systems were noted in the above HPI section. All other review of systems were otherwise negative.    Current Medications, Allergies, Past Medical History, Past Surgical History, Family History and Social History were reviewed in [employer] electronic medical record  Vital signs were reviewed in today's medical record. Physical Exam: General: Well developed , well nourished, no acute distress Head: Normocephalic and atraumatic Eyes:  sclerae anicteric, EOMI Ears: Normal auditory acuity Mouth: No deformity or lesions Lungs: Clear throughout to auscultation Heart: Regular rate and rhythm; no murmurs, rubs or bruits Abdomen: Soft, non tender and non distended. No masses, hepatosplenomegaly or hernias noted. Normal Bowel sounds Rectal:deferred Musculoskeletal: Symmetrical with no gross deformities  Pulses:  Normal pulses noted Extremities: No clubbing, cyanosis, edema or deformities noted Neurological: Alert oriented x 4, grossly  nonfocal Psychological:  Alert and cooperative. Normal mood and affect

## 2011-03-03 NOTE — Assessment & Plan Note (Signed)
The symptoms are very suggestive of biliary colic despite her negative ultrasound. Minor LFT abnormalities may be reflective of this. Hepatic steatosis is also a possible etiology for her LFT abnormalities.  Recommendations #1 HIDA scan #2 repeat LFTs #3 hyomax as needed for pain #4 the patient was carefully instructed to contact me if she has another episode of severe pain

## 2011-03-07 ENCOUNTER — Other Ambulatory Visit: Payer: Self-pay | Admitting: Internal Medicine

## 2011-03-07 DIAGNOSIS — Z1231 Encounter for screening mammogram for malignant neoplasm of breast: Secondary | ICD-10-CM

## 2011-03-17 ENCOUNTER — Telehealth: Payer: Self-pay | Admitting: Gastroenterology

## 2011-03-17 NOTE — Telephone Encounter (Signed)
Pt states she is still having problems with nausea and abdominal pain. She is scheduled for the Hida scan next week. Pt states that the hyomax we gave her did not do anything, it did not help. She states she was given some Dexilant samples and they helped but she is out of the samples. Pt request script for Dexilant and would like to try something other than the Hyomax. Dr. Arlyce Dice please advise.

## 2011-03-18 NOTE — Telephone Encounter (Signed)
Ok for [employer] and bentyl 10mg  qid prn

## 2011-03-20 MED ORDER — DICYCLOMINE HCL 10 MG PO CAPS: ORAL_CAPSULE | ORAL | Status: DC

## 2011-03-20 MED ORDER — DEXLANSOPRAZOLE 30 MG PO CPDR: 30.0000 mg | DELAYED_RELEASE_CAPSULE | Freq: Every day | ORAL | Status: DC

## 2011-03-20 NOTE — Telephone Encounter (Signed)
Pt aware.

## 2011-03-21 ENCOUNTER — Telehealth: Payer: Self-pay | Admitting: Gastroenterology

## 2011-03-21 MED ORDER — DEXLANSOPRAZOLE 30 MG PO CPDR: 30.0000 mg | DELAYED_RELEASE_CAPSULE | Freq: Every day | ORAL | Status: AC

## 2011-03-21 NOTE — Telephone Encounter (Signed)
Per pharmacy med can be sent directly to pharmacy. Dexilant sent to pharmacy

## 2011-03-22 ENCOUNTER — Encounter (HOSPITAL_COMMUNITY)
Admission: RE | Admit: 2011-03-22 | Discharge: 2011-03-22 | Disposition: A | Discharge status: Home / Self Care | Payer: BC Managed Care – PPO | Source: Ambulatory Visit | Attending: Gastroenterology | Admitting: Gastroenterology

## 2011-03-22 DIAGNOSIS — R109 Unspecified abdominal pain: Secondary | ICD-10-CM | POA: Insufficient documentation

## 2011-03-22 MED ORDER — TECHNETIUM TC 99M MEBROFENIN IV KIT
5.0000 | PACK | Freq: Once | INTRAVENOUS | Status: AC | PRN
  Administered 2011-03-22: 5 via INTRAVENOUS

## 2011-03-23 ENCOUNTER — Telehealth: Payer: Self-pay

## 2011-03-23 NOTE — Telephone Encounter (Signed)
Pt aware. Pt scheduled to follow-up with Dr. Arlyce Dice 04/24/11@8 :30am. Pt aware of appt date and time.

## 2011-03-23 NOTE — Telephone Encounter (Signed)
Message copied by Michele Mcalpine on Thu Mar 23, 2011 10:35 AM ------      Message from: Melvia Heaps MD D      Created: Thu Mar 23, 2011  9:55 AM       Scan is norma      .  Instruct pt to contact me if she has any further episodes of pain.      OV 1 month

## 2011-03-27 ENCOUNTER — Ambulatory Visit
Admission: RE | Admit: 2011-03-27 | Discharge: 2011-03-27 | Disposition: A | Discharge status: Home / Self Care | Payer: BC Managed Care – PPO | Source: Ambulatory Visit | Attending: Internal Medicine | Admitting: Internal Medicine

## 2011-03-27 ENCOUNTER — Telehealth: Payer: Self-pay | Admitting: *Deleted

## 2011-03-27 ENCOUNTER — Ambulatory Visit: Payer: BC Managed Care – PPO | Admitting: Gastroenterology

## 2011-03-27 DIAGNOSIS — Z1231 Encounter for screening mammogram for malignant neoplasm of breast: Secondary | ICD-10-CM

## 2011-03-27 MED ORDER — ESOMEPRAZOLE MAGNESIUM 40 MG PO CPDR: 40.0000 mg | DELAYED_RELEASE_CAPSULE | Freq: Every day | ORAL | Status: DC

## 2011-03-27 NOTE — Telephone Encounter (Signed)
Nexium sent in to pharmacy. Pt willing to try Something else beside omeprazole. Pt will need to try one more preferred before medication before dexilant is covered

## 2011-03-27 NOTE — Telephone Encounter (Signed)
Explained to pt to contact the office if she needed samples until we get this figured out.  L/M for pt

## 2011-03-27 NOTE — Telephone Encounter (Signed)
Called pt to inform that [employer] contacted the office and stated that the Dexilant didn't need a prior authorization. Explained

## 2011-03-28 ENCOUNTER — Encounter: Payer: Self-pay | Admitting: *Deleted

## 2011-03-28 ENCOUNTER — Ambulatory Visit: Payer: BC Managed Care – PPO

## 2011-04-24 ENCOUNTER — Encounter: Payer: Self-pay | Admitting: Gastroenterology

## 2011-04-24 ENCOUNTER — Ambulatory Visit (INDEPENDENT_AMBULATORY_CARE_PROVIDER_SITE_OTHER): Payer: BC Managed Care – PPO | Admitting: Gastroenterology

## 2011-04-24 DIAGNOSIS — R109 Unspecified abdominal pain: Secondary | ICD-10-CM

## 2011-04-24 DIAGNOSIS — R1011 Right upper quadrant pain: Secondary | ICD-10-CM

## 2011-04-24 MED ORDER — HYOSCYAMINE SULFATE ER 0.375 MG PO TBCR: EXTENDED_RELEASE_TABLET | ORAL | Status: DC

## 2011-04-24 MED ORDER — DEXLANSOPRAZOLE 30 MG PO CPDR: 60.0000 mg | DELAYED_RELEASE_CAPSULE | Freq: Every day | ORAL | Status: DC

## 2011-04-24 NOTE — Assessment & Plan Note (Signed)
I remain suspicious that her symptoms are due to chronic cholecystitis despite negative HIDA scan and ultrasound. At the same time, ulcer and non-ulcer dyspepsia should be ruled out.  Recommendations #1 trial of hyomax 0.375 mg twice a day #2 upper endoscopy

## 2011-04-24 NOTE — Patient Instructions (Signed)
Upper GI Endoscopy Upper GI endoscopy means using a flexible scope to look at the esophagus, stomach and upper small bowel. This is done to make a diagnosis in people with heartburn, abdominal pain, or abnormal bleeding. Sometimes an endoscope is needed to remove foreign bodies or food that become stuck in the esophagus; it can also be used to take biopsy samples. For the best results, do not eat or drink for 8 hours before having your upper endoscopy.  To perform the endoscopy, you will probably be sedated and your throat will be numbed with a special spray. The endoscope is then slowly passed down your throat (this will not interfere with your breathing). An endoscopy exam takes 15-30 minutes to complete and there is no real pain. Patients rarely remember much about the procedure. The results of the test may take several days if a biopsy or other test is taken.  You may have a sore throat after an endoscopy exam. Serious complications are very rare. Stick to liquids and soft foods until your pain is better. You should not drive a car or operate any dangerous equipment for at least 24 hours after being sedated. SEEK IMMEDIATE MEDICAL CARE IF:  You have severe throat pain.   You have shortness of breath.   You have bleeding problems.   You have a fever.   You have difficulty recovering from your sedation.  Document Released: 08/24/2004 Document Re-Released: 10/11/2009 [facility] Patient Information 2011 [place], [place]. Your Endoscopy is scheduled on 04/27/2011 at 3pm

## 2011-04-24 NOTE — Progress Notes (Signed)
History of Present Illness:  Samantha Howell has returned for followup of her abdominal pain. Postprandial upper abdominal pain continues intermittently. It is without radiation. It is moderately severe. She is taking dexilant twice a day. She denies pyrosis. The papillary scan was negative.    Review of Systems: Pertinent positive and negative review of systems were noted in the above HPI section. All other review of systems were otherwise negative.    Current Medications, Allergies, Past Medical History, Past Surgical History, Family History and Social History were reviewed in [employer] electronic medical record  Vital signs were reviewed in today's medical record. Physical Exam: General: Well developed , well nourished, no acute distress  On abdominal exam there is mild tenderness to palpation in the upper epigastrium. This is unchanged with abdominal muscle wall flexion

## 2011-04-27 ENCOUNTER — Other Ambulatory Visit: Payer: BC Managed Care – PPO | Admitting: Gastroenterology

## 2011-05-17 ENCOUNTER — Encounter: Payer: BC Managed Care – PPO | Admitting: Internal Medicine

## 2011-05-17 ENCOUNTER — Ambulatory Visit (AMBULATORY_SURGERY_CENTER): Payer: BC Managed Care – PPO | Admitting: Gastroenterology

## 2011-05-17 ENCOUNTER — Telehealth: Payer: Self-pay

## 2011-05-17 ENCOUNTER — Encounter: Payer: Self-pay | Admitting: Gastroenterology

## 2011-05-17 DIAGNOSIS — R109 Unspecified abdominal pain: Secondary | ICD-10-CM

## 2011-05-17 DIAGNOSIS — R1011 Right upper quadrant pain: Secondary | ICD-10-CM

## 2011-05-17 MED ORDER — SODIUM CHLORIDE 0.9 % IV SOLN: 500.0000 mL | INTRAVENOUS | Status: DC

## 2011-05-17 NOTE — Patient Instructions (Signed)
Follow your discharge instructions on the [facility] and [facility] Discharge Instruction Sheets.  Continue your medications.  Dr. Marzetta Board office will call you with a Surgical Consult Appointment.

## 2011-05-17 NOTE — Telephone Encounter (Signed)
Pt scheduled for surgical appt for possible cholecystectomy. Pt scheduled to see Dr. Claud Kelp 05/24/11 arrival time 10:30am for an 11am appt. Pt aware of appt date and time.

## 2011-05-18 ENCOUNTER — Telehealth: Payer: Self-pay | Admitting: *Deleted

## 2011-05-18 NOTE — Telephone Encounter (Signed)

## 2011-05-19 ENCOUNTER — Telehealth: Payer: Self-pay | Admitting: *Deleted

## 2011-05-19 NOTE — Telephone Encounter (Signed)
Message copied by Marlowe Kays on Fri May 19, 2011  4:56 PM ------      Message from: Melvia Heaps D      Created: Wed May 17, 2011 11:59 AM       Please schedule app't with Dr. Derrell Lolling for possible cholecystectomy

## 2011-05-19 NOTE — Telephone Encounter (Signed)
Pt has an appointment to see Dr Derrell Lolling on 05/24/2011 at 11am. At [facility] Tried to contact pt to inform No Answer

## 2011-05-22 ENCOUNTER — Telehealth: Payer: Self-pay | Admitting: Gastroenterology

## 2011-05-22 DIAGNOSIS — K829 Disease of gallbladder, unspecified: Secondary | ICD-10-CM

## 2011-05-22 NOTE — Telephone Encounter (Signed)
Pt states that she had an endo last week on Wed. She reports that on Thursday she started having some cramping and her BM's have changed. She states it is like gravel in the toiled and it is pale in color. Pt wants to know if this is normal or related to issues with her gallbladder. Please advise.

## 2011-05-23 ENCOUNTER — Encounter: Payer: Self-pay | Admitting: Internal Medicine

## 2011-05-23 ENCOUNTER — Ambulatory Visit (INDEPENDENT_AMBULATORY_CARE_PROVIDER_SITE_OTHER): Payer: BC Managed Care – PPO | Admitting: Internal Medicine

## 2011-05-23 ENCOUNTER — Telehealth: Payer: Self-pay | Admitting: Gastroenterology

## 2011-05-23 ENCOUNTER — Other Ambulatory Visit: Payer: BC Managed Care – PPO

## 2011-05-23 DIAGNOSIS — E785 Hyperlipidemia, unspecified: Secondary | ICD-10-CM

## 2011-05-23 DIAGNOSIS — Z0000 Encounter for general adult medical examination without abnormal findings: Secondary | ICD-10-CM

## 2011-05-23 DIAGNOSIS — R1011 Right upper quadrant pain: Secondary | ICD-10-CM

## 2011-05-23 DIAGNOSIS — N943 Premenstrual tension syndrome: Secondary | ICD-10-CM

## 2011-05-23 LAB — HEPATIC FUNCTION PANEL
ALT: 19 U/L (ref 0–35)
Albumin: 3.8 g/dL (ref 3.5–5.2)
Total Protein: 6.8 g/dL (ref 6.0–8.3)

## 2011-05-23 MED ORDER — NORGESTREL-ETHINYL ESTRADIOL 0.5-50 MG-MCG PO TABS: 1.0000 | ORAL_TABLET | Freq: Every day | ORAL | Status: DC

## 2011-05-23 NOTE — Telephone Encounter (Signed)
Pt states that her urine is dark occasionally. She states she is trying to drink lots of fluids. Pt is seeing Dr. Alphonsus Sias today and would like to have LFT's checked there if possible. Orders entered.

## 2011-05-23 NOTE — Assessment & Plan Note (Signed)
With recent pale stools and RUQ tenderness---probably needs gallbladder out Seeing Dr Derrell Lolling tomorrow

## 2011-05-23 NOTE — Assessment & Plan Note (Signed)
Generally healthy Hopes to restart exercise when done with abdominal issues Had mammo Pap due 2014

## 2011-05-23 NOTE — Telephone Encounter (Signed)
Pt calling for lab results.

## 2011-05-23 NOTE — Assessment & Plan Note (Signed)
Discussed primary prevention Lifestyle Rx only

## 2011-05-23 NOTE — Assessment & Plan Note (Signed)
Does okay with the oral contraceptive

## 2011-05-23 NOTE — Progress Notes (Signed)
Subjective:    Patient ID: Samantha Howell, female    DOB: [birth year], 45 y.o.   MRN: [id number]  HPI Has been seeing Dr Arlyce Dice EGD normal HIDA was normal but still thinking that it is gallbladder Seeing Dr Derrell Lolling to discuss surgery Now with intestinal cramping Stools are pale in color and "look like gravel"  Had shingles recently Asks about vaccine Will need to defer due to age  Had liposcience study through work LDL 166 Has not been exercising recently due to stomach issue----generally active though  Current Outpatient Prescriptions on File Prior to Visit  Medication Sig Dispense Refill  . cetirizine (ZYRTEC) 10 MG tablet Take 10 mg by mouth daily.        Marland Kitchen Dexlansoprazole (DEXILANT) 30 MG capsule Take 2 capsules (60 mg total) by mouth daily.  30 capsule  2  . ibuprofen (ADVIL,MOTRIN) 200 MG tablet Take 200 mg by mouth as needed.       . norgestrel-ethinyl estradiol (OGESTROL) 0.5-50 MG-MCG per tablet Take 1 tablet by mouth daily.  90 tablet  1   Current Facility-Administered Medications on File Prior to Visit  Medication Dose Route Frequency Provider Last Rate Last Dose  . 0.9 %  sodium chloride infusion  500 mL Intravenous Continuous Louis Meckel, MD        Allergies  Allergen Reactions  . Penicillins   . Sulfonamide Derivatives     Past Medical History  Diagnosis Date  . Allergic rhinitis   . Fibrocystic breast   . GERD (gastroesophageal reflux disease)   . Migraines   . HLD (hyperlipidemia)     Past Surgical History  Procedure Date  . Tubal ligation 1992  . Breast lumpectomy 12/00    Left breast    Family History  Problem Relation Age of Onset  . Heart attack Father   . Hyperlipidemia Father   . Hearing loss Father   . Early menopause Mother   . Breast cancer      MGGM  . Ovarian cancer Paternal Aunt   . Cancer Maternal Grandmother     Bladder  . Crohn's disease Sister     History   Social History  . Marital Status: Married    Spouse  Name: N/A    Number of Children: 2  . Years of Education: N/A   Occupational History  . Customer Service [employer]   Social History Main Topics  . Smoking status: Former Smoker    Quit date: 07/31/1996  . Smokeless tobacco: Never Used  . Alcohol Use: Yes     Occasional  . Drug Use: No  . Sexually Active: Not on file   Other Topics Concern  . 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       Wears seat belt  HENT: Negative for hearing loss, congestion, rhinorrhea, dental problem and tinnitus.        Regular with dentist  Eyes: Negative for visual disturbance.       No diplopia or unilateral vision loss  Respiratory: Negative for cough, chest tightness and shortness of breath.   Cardiovascular: Negative for chest pain, palpitations and leg swelling.  Gastrointestinal: Positive for nausea and abdominal pain. Negative for vomiting and blood in stool.       No heartburn  Genitourinary: Negative for dysuria, urgency, frequency, difficulty urinating and dyspareunia.  Periods regular with OCP  Musculoskeletal: Negative for back pain, joint swelling and arthralgias.  Skin: Negative for rash.       No suspicious areas  Neurological: Positive for numbness. Negative for dizziness, syncope, weakness, light-headedness and headaches.       Occ right hand numbness---positional  (brief in median nerve distribution)  Hematological: Positive for adenopathy. Does not bruise/bleed easily.       Tender "gland" behind/below right ear---seems to have expanded in tenderness lately  Psychiatric/Behavioral: Positive for sleep disturbance. Negative for dysphoric mood. The patient is not nervous/anxious.        Chronic sleep problems--no sig change occ daytime somnolence but no abnormal sleep pressure       Objective:   Physical Exam  Constitutional: She is  oriented to person, place, and time. She appears well-developed and well-nourished. No distress.  HENT:  Head: Normocephalic and atraumatic.  Right Ear: External ear normal.  Left Ear: External ear normal.  Mouth/Throat: Oropharynx is clear and moist. No oropharyngeal exudate.       TMs normal  Eyes: Conjunctivae and EOM are normal. Pupils are equal, round, and reactive to light.       Fundi benign  Neck: Normal range of motion. Neck supple. No thyromegaly present.  Cardiovascular: Normal rate, regular rhythm, normal heart sounds and intact distal pulses.  Exam reveals no gallop.   No murmur heard. Pulmonary/Chest: Effort normal and breath sounds normal. No respiratory distress. She has no wheezes. She has no rales.  Abdominal: Soft. There is tenderness.       Mild RUQ and RLQ tenderness  Genitourinary:       Breasts are dense and with moderate cystic changes  Musculoskeletal: Normal range of motion. She exhibits no edema and no tenderness.  Lymphadenopathy:       Head (right side): No preauricular, no posterior auricular and no occipital adenopathy present.    She has no cervical adenopathy.    She has no axillary adenopathy.  Neurological: She is alert and oriented to person, place, and time.  Skin: Skin is warm. No rash noted.  Psychiatric: She has a normal mood and affect. Her behavior is normal. Judgment and thought content normal.          Assessment & Plan:

## 2011-05-23 NOTE — Telephone Encounter (Signed)
This is not related to her EGD.  Pale stools - is her urine dark?  Could be related to GB disease. She needs LFTs

## 2011-05-24 ENCOUNTER — Other Ambulatory Visit (INDEPENDENT_AMBULATORY_CARE_PROVIDER_SITE_OTHER): Payer: Self-pay | Admitting: General Surgery

## 2011-05-24 ENCOUNTER — Ambulatory Visit (INDEPENDENT_AMBULATORY_CARE_PROVIDER_SITE_OTHER): Payer: BC Managed Care – PPO | Admitting: General Surgery

## 2011-05-24 ENCOUNTER — Encounter (INDEPENDENT_AMBULATORY_CARE_PROVIDER_SITE_OTHER): Payer: Self-pay | Admitting: General Surgery

## 2011-05-24 VITALS — BP 124/86 | HR 66 | Temp 96.8°F | Resp 14 | Ht 60.0 in | Wt 138.6 lb

## 2011-05-24 DIAGNOSIS — R1013 Epigastric pain: Secondary | ICD-10-CM

## 2011-05-24 NOTE — Telephone Encounter (Signed)
LFTs are normal

## 2011-05-24 NOTE — Progress Notes (Addendum)
Chief Complaint  Patient presents with  . Other    Eval possible cholecystectomy    HPI Samantha Howell is a 45 y.o. female.    This is a healthy, pleasant middle-aged woman who is referred to me by Dr. Melvia Heaps for evaluation of epigastric pain. It is strongly suspected this is gallbladder disease although workup is negative to date. Her primary care physician is Dr. Sharen Hones at [facility].  The patient began having abdominal pain in July of 2012. She said  the first episode awoke her in the morning and was associated with right upper quadrant and epigastric pain and belching. It was very severe and lasted for hours and then resolved. It was so severe she states she could not breathe. She saw her primary care physician. She says he was suspicious of gallbladder disease. An ultrasound was performed which was normal. She was then referred to Dr. Melvia Heaps.  Since that time she has had  frequent intermittent episodes of right upper quadrant epigastric pain and nausea. She denies fever chills or vomiting. She'll develop diarrhea within the last week after having her endoscopy. She says the pain is exacerbated by meals and she has a fair amount of bloating. The pain will completely resolve only to recur later.  Hepatobiliary scan with ejection fraction was performed on August 22 and that is normal. Liver function tests have been done repeatedly. The initial set of liver function test on July 23 showed a slight elevation of AST and ALT, but repeated sets of liver profile have been normal since. An upper endoscopy was performed on October 17 and that is completely normal. She has been on double dose proton pump inhibitors and actually she states that that has improved some reflux symptoms she has had but has not affected the pain at all.  She states that she is somewhat fearful of cancer. She denies any prior history of liver disease cardiovascular disease pulmonary disease or urologic  problems. She has not had a CT scan.  The only surgery she says a tubal ligation in 1992 and a breast biopsy in 2000.  History is significant for cholecystectomy in the maternal grandmother and maternal grandfather but a no one else. She has a sister and nephew with Crohn's disease. She is in no distress today.  HPI  Past Medical History  Diagnosis Date  . Allergic rhinitis   . Fibrocystic breast   . GERD (gastroesophageal reflux disease)   . Migraines   . HLD (hyperlipidemia)   . Abdominal pain   . Nausea     Past Surgical History  Procedure Date  . Breast lumpectomy 12/00    Left breast  . Tubal ligation 1992    Family History  Problem Relation Age of Onset  . Heart attack Father   . Hyperlipidemia Father   . Hearing loss Father   . Early menopause Mother   . Breast cancer      MGGM  . Ovarian cancer Paternal Aunt   . Cancer Paternal Aunt     ovarian  . Cancer Maternal Grandmother     Bladder, breast  . Crohn's disease Sister     Social History History  Substance Use Topics  . Smoking status: Former Smoker    Quit date: 07/31/1996  . Smokeless tobacco: Never Used  . Alcohol Use: Yes     Occasional    Allergies  Allergen Reactions  . Penicillins Nausea Only  . Sulfonamide Derivatives Hives  Happened when she was a child.    Current Outpatient Prescriptions  Medication Sig Dispense Refill  . cetirizine (ZYRTEC) 10 MG tablet Take 10 mg by mouth daily.        Marland Kitchen Dexlansoprazole (DEXILANT) 30 MG capsule Take 2 capsules (60 mg total) by mouth daily.  30 capsule  2  . ibuprofen (ADVIL,MOTRIN) 200 MG tablet Take 200 mg by mouth as needed.       . norgestrel-ethinyl estradiol (OGESTROL) 0.5-50 MG-MCG tablet Take 1 tablet by mouth daily.  3 Package  3   Current Facility-Administered Medications  Medication Dose Route Frequency Provider Last Rate Last Dose  . DISCONTD: 0.9 %  sodium chloride infusion  500 mL Intravenous Continuous Louis Meckel, MD         Review of Systems Review of Systems  Constitutional: Negative for fever, chills and unexpected weight change.  HENT: Negative for hearing loss, congestion, sore throat, trouble swallowing and voice change.   Eyes: Negative for visual disturbance.  Respiratory: Negative for cough and wheezing.   Cardiovascular: Negative for chest pain, palpitations and leg swelling.  Gastrointestinal: Positive for nausea, abdominal pain and diarrhea. Negative for vomiting, constipation, blood in stool, abdominal distention and anal bleeding.  Genitourinary: Negative for hematuria, vaginal bleeding and difficulty urinating.  Musculoskeletal: Negative for arthralgias.  Skin: Negative for rash and wound.  Neurological: Negative for seizures, syncope and headaches.  Hematological: Negative for adenopathy. Does not bruise/bleed easily.  Psychiatric/Behavioral: Negative for confusion.    Blood pressure 124/86, pulse 66, temperature 96.8 F (36 C), temperature source Temporal, resp. rate 14, height 5' (1.524 m), weight 138 lb 9.6 oz (62.869 kg), last menstrual period 05/11/2011.  Physical Exam Physical Exam  Constitutional: She is oriented to person, place, and time. She appears well-developed and well-nourished. No distress.  HENT:  Head: Normocephalic and atraumatic.  Nose: Nose normal.  Mouth/Throat: No oropharyngeal exudate.  Eyes: Conjunctivae and EOM are normal. Pupils are equal, round, and reactive to light. Left eye exhibits no discharge. No scleral icterus.  Neck: Neck supple. No JVD present. No tracheal deviation present. No thyromegaly present.  Cardiovascular: Normal rate, regular rhythm, normal heart sounds and intact distal pulses.   No murmur heard. Pulmonary/Chest: Effort normal and breath sounds normal. No respiratory distress. She has no wheezes. She has no rales. She exhibits no tenderness.  Abdominal: Soft. Bowel sounds are normal. She exhibits no distension and no mass. There is  tenderness. There is no rebound and no guarding.       Subjective epigastric tenderness. RUQ less tender. No mass. No distention.  Musculoskeletal: She exhibits no edema and no tenderness.  Lymphadenopathy:    She has no cervical adenopathy.  Neurological: She is alert and oriented to person, place, and time. She exhibits normal muscle tone. Coordination normal.  Skin: Skin is warm. No rash noted. She is not diaphoretic. No erythema. No pallor.  Psychiatric: She has a normal mood and affect. Her behavior is normal. Judgment and thought content normal.    Data Reviewed I reviewed Dr. Marzetta Board office notes, the ultrasound and biliary scan reports, the endoscopy report and photographs, the lab tests.   Assessment    3 month history of intermittent epigastric and right upper quadrant abdominal pain and nausea. History very consistent with biliary colic.  Workup to date is negative for biliary tract disease, esophageal or gastric disease.  Mild reflux symptoms, resolved following use of proton pump inhibitors, no evidence of esophagitis on endoscopy.  Status post laparoscopic tubal ligation.    Plan    We had a very long discussion about her workup to date and her symptoms. I told her that cholecystectomy was a reasonable option at this point, but if the goal of care is complete resolution of her symptoms, the chance of that happening is about 50%.  We talked about differential diagnosis. The only other issue that I can think of is pancreatic. She was to be sure that she doesn't have pancreatic disease.  Before committing to a cholecystectomy, we're going to get a CT scan of the abdomen with pancreatic protocol, amylase, lipase, CEA, and CA 19-9.  We will discuss the results of these tests by phone after they are done. If they're negative then I'll offer her a laparoscopic cholecystectomy with cholangiogram.  I discussed the indications and details of cholecystectomy with her. Risks and  complications were outlined, including but limited to bleeding, infection, conversion to open laparotomy, bile leak, wound healing problems, injury to adjacent organs such as the main bile duct or intestine with ajor reconstructive surgery, cardiac pulmonary and thromboembolic problems. She is also aware that her symptoms may not resolve following cholecystectomy because of the non-diagnostic workup. She seems to understand all these issues well.   her questions were answered. We will make a decision about cholecystectomy after the above workup is completed.    ADDENDUM: CT scan of the abdomen shows no abnormalities of the pancreas liver gallbladder or adrenal glands. There is probably a small nonobstructing left upper pole renal calculus. No explanation for her abdominal pain. Laboratory including  amylase lipase CEA and CA 19-9 Are normal.  I called the patient today and discussed this with her. We talked about different strategies for care. She wants to go ahead and schedule for a laparoscopic cholecystectomy with cholangiogram. We will set this up for her.   Mckinsley Koelzer M 05/24/2011, 11:57 AM

## 2011-05-24 NOTE — Telephone Encounter (Signed)
Pt aware.

## 2011-05-24 NOTE — Patient Instructions (Signed)
I am suspicious that you have gallbladder disease and that gallbladder disease is causing your pain. The diagnosis is not confirmed after extensive workup. We talked about different approaches to your care. We decided to perform a CT scan of the abdomen with pancreatic protocol and some blood tests to make sure there is no disease of the pancreas. If all these tests are normal then it would be reasonable to proceed with laparoscopic cholecystectomy with cholangiogram. We will discuss this by phone after the tests are done. I would continue to take the dexilant and to adhere to a very low-fat diet. I would also recommend that you abstain from alcohol.

## 2011-05-25 ENCOUNTER — Ambulatory Visit
Admission: RE | Admit: 2011-05-25 | Discharge: 2011-05-25 | Disposition: A | Discharge status: Home / Self Care | Payer: BC Managed Care – PPO | Source: Ambulatory Visit | Attending: General Surgery | Admitting: General Surgery

## 2011-05-25 DIAGNOSIS — R1013 Epigastric pain: Secondary | ICD-10-CM

## 2011-05-25 LAB — LIPASE: Lipase: 33 U/L (ref 0–75)

## 2011-05-25 MED ORDER — IOHEXOL 300 MG/ML  SOLN
100.0000 mL | Freq: Once | INTRAMUSCULAR | Status: AC | PRN
  Administered 2011-05-25: 100 mL via INTRAVENOUS

## 2011-05-29 ENCOUNTER — Encounter: Payer: Self-pay | Admitting: Internal Medicine

## 2011-05-30 ENCOUNTER — Telehealth (INDEPENDENT_AMBULATORY_CARE_PROVIDER_SITE_OTHER): Payer: Self-pay

## 2011-05-30 NOTE — Telephone Encounter (Signed)
Pt advised that orders have gone to surgery scheduling. Pt also advised to d/c advil, aspirin or other blood thinners 5 days pre op.

## 2011-06-06 ENCOUNTER — Other Ambulatory Visit (INDEPENDENT_AMBULATORY_CARE_PROVIDER_SITE_OTHER): Payer: Self-pay | Admitting: General Surgery

## 2011-06-08 ENCOUNTER — Encounter (HOSPITAL_COMMUNITY): Payer: BC Managed Care – PPO

## 2011-06-08 ENCOUNTER — Encounter (HOSPITAL_COMMUNITY): Payer: Self-pay

## 2011-06-08 LAB — COMPREHENSIVE METABOLIC PANEL
AST: 20 U/L (ref 0–37)
Albumin: 3.5 g/dL (ref 3.5–5.2)
Alkaline Phosphatase: 45 U/L (ref 39–117)
Chloride: 103 mEq/L (ref 96–112)
Creatinine, Ser: 0.7 mg/dL (ref 0.50–1.10)
Potassium: 3.9 mEq/L (ref 3.5–5.1)
Sodium: 139 mEq/L (ref 135–145)
Total Bilirubin: 0.1 mg/dL — ABNORMAL LOW (ref 0.3–1.2)

## 2011-06-08 LAB — SURGICAL PCR SCREEN
MRSA, PCR: NEGATIVE
Staphylococcus aureus: NEGATIVE

## 2011-06-08 LAB — CBC
MCH: 28.6 pg (ref 26.0–34.0)
MCV: 86.1 fL (ref 78.0–100.0)
Platelets: 306 10*3/uL (ref 150–400)
RBC: 5.04 MIL/uL (ref 3.87–5.11)

## 2011-06-12 ENCOUNTER — Other Ambulatory Visit (INDEPENDENT_AMBULATORY_CARE_PROVIDER_SITE_OTHER): Payer: Self-pay | Admitting: General Surgery

## 2011-06-12 NOTE — H&P (Signed)
Samantha Howell   05/24/2011 11:00 AM Office Visit  MRN: [id number]   Description: 45 year old female  Provider: Ernestene Mention, MD  Department: [facility]        Diagnoses     Abdominal pain, epigastric   - Primary    789.06      Reason for Visit     Other    Eval possible cholecystectomy        Vitals - Last Recorded       BP Pulse Temp(Src) Resp Ht Wt    124/86  66  96.8 F (36 C) (Temporal)  14  5' (1.524 m)  138 lb 9.6 oz (62.869 kg)          BMI LMP            27.07 kg/m2  05/11/2011               Progress Notes     Ernestene Mention, MD  05/29/2011  6:08 PM  AddendumChief Complaint   Patient presents with   .  Other       Eval possible cholecystectomy      HPI Samantha Howell is a 45 y.o. female.     This is a healthy, pleasant middle-aged woman who is referred to me by Dr. Melvia Heaps for evaluation of epigastric pain. It is strongly suspected this is gallbladder disease although workup is negative to date. Her primary care physician is Dr. Sharen Hones at [facility].   The patient began having abdominal pain in July of 2012. She said  the first episode awoke her in the morning and was associated with right upper quadrant and epigastric pain and belching. It was very severe and lasted for hours and then resolved. It was so severe she states she could not breathe. She saw her primary care physician. She says he was suspicious of gallbladder disease. An ultrasound was performed which was normal. She was then referred to Dr. Melvia Heaps.   Since that time she has had  frequent intermittent episodes of right upper quadrant epigastric pain and nausea. She denies fever chills or vomiting. She'll develop diarrhea within the last week after having her endoscopy. She says the pain is exacerbated by meals and she has a fair amount of bloating. The pain will completely resolve only to recur later.   Hepatobiliary scan with ejection fraction was  performed on August 22 and that is normal. Liver function tests have been done repeatedly. The initial set of liver function test on July 23 showed a slight elevation of AST and ALT, but repeated sets of liver profile have been normal since. An upper endoscopy was performed on October 17 and that is completely normal. She has been on double dose proton pump inhibitors and actually she states that that has improved some reflux symptoms she has had but has not affected the pain at all.   She states that she is somewhat fearful of cancer. She denies any prior history of liver disease cardiovascular disease pulmonary disease or urologic problems. She has not had a CT scan.   The only surgery she says a tubal ligation in 1992 and a breast biopsy in 2000.   History is significant for cholecystectomy in the maternal grandmother and maternal grandfather but a no one else. She has a sister and nephew with Crohn's disease. She is in no distress today.   HPI    Past Medical History   Diagnosis  Date   .  Allergic rhinitis     .  Fibrocystic breast     .  GERD (gastroesophageal reflux disease)     .  Migraines     .  HLD (hyperlipidemia)     .  Abdominal pain     .  Nausea         Past Surgical History   Procedure  Date   .  Breast lumpectomy  12/00       Left breast   .  Tubal ligation  1992       Family History   Problem  Relation  Age of Onset   .  Heart attack  Father     .  Hyperlipidemia  Father     .  Hearing loss  Father     .  Early menopause  Mother     .  Breast cancer           MGGM   .  Ovarian cancer  Paternal Aunt     .  Cancer  Paternal Aunt         ovarian   .  Cancer  Maternal Grandmother         Bladder, breast   .  Crohn's disease  Sister        Social History History   Substance Use Topics   .  Smoking status:  Former Smoker       Quit date:  07/31/1996   .  Smokeless tobacco:  Never Used   .  Alcohol Use:  Yes         Occasional       Allergies     Allergen  Reactions   .  Penicillins  Nausea Only   .  Sulfonamide Derivatives  Hives       Happened when she was a child.       Current Outpatient Prescriptions   Medication  Sig  Dispense  Refill   .  cetirizine (ZYRTEC) 10 MG tablet  Take 10 mg by mouth daily.           Marland Kitchen  Dexlansoprazole (DEXILANT) 30 MG capsule  Take 2 capsules (60 mg total) by mouth daily.   30 capsule   2   .  ibuprofen (ADVIL,MOTRIN) 200 MG tablet  Take 200 mg by mouth as needed.          .  norgestrel-ethinyl estradiol (OGESTROL) 0.5-50 MG-MCG tablet  Take 1 tablet by mouth daily.   3 Package   3       Current Facility-Administered Medications   Medication  Dose  Route  Frequency  Provider  Last Rate  Last Dose   .  DISCONTD: 0.9 %  sodium chloride infusion   500 mL  Intravenous  Continuous  Louis Meckel, MD            Review of Systems Review of Systems  Constitutional: Negative for fever, chills and unexpected weight change.  HENT: Negative for hearing loss, congestion, sore throat, trouble swallowing and voice change.   Eyes: Negative for visual disturbance.  Respiratory: Negative for cough and wheezing.   Cardiovascular: Negative for chest pain, palpitations and leg swelling.  Gastrointestinal: Positive for nausea, abdominal pain and diarrhea. Negative for vomiting, constipation, blood in stool, abdominal distention and anal bleeding.  Genitourinary: Negative for hematuria, vaginal bleeding and difficulty urinating.  Musculoskeletal: Negative for arthralgias.  Skin: Negative for rash and wound.  Neurological: Negative for seizures, syncope and headaches.  Hematological: Negative for adenopathy. Does not bruise/bleed easily.  Psychiatric/Behavioral: Negative for confusion.    Blood pressure 124/86, pulse 66, temperature 96.8 F (36 C), temperature source Temporal, resp. rate 14, height 5' (1.524 m), weight 138 lb 9.6 oz (62.869 kg), last menstrual period 05/11/2011.   Physical Exam Physical  Exam  Constitutional: She is oriented to person, place, and time. She appears well-developed and well-nourished. No distress.  HENT:   Head: Normocephalic and atraumatic.   Nose: Nose normal.   Mouth/Throat: No oropharyngeal exudate.  Eyes: Conjunctivae and EOM are normal. Pupils are equal, round, and reactive to light. Left eye exhibits no discharge. No scleral icterus.  Neck: Neck supple. No JVD present. No tracheal deviation present. No thyromegaly present.  Cardiovascular: Normal rate, regular rhythm, normal heart sounds and intact distal pulses.    No murmur heard. Pulmonary/Chest: Effort normal and breath sounds normal. No respiratory distress. She has no wheezes. She has no rales. She exhibits no tenderness.  Abdominal: Soft. Bowel sounds are normal. She exhibits no distension and no mass. There is tenderness. There is no rebound and no guarding.       Subjective epigastric tenderness. RUQ less tender. No mass. No distention.  Musculoskeletal: She exhibits no edema and no tenderness.  Lymphadenopathy:    She has no cervical adenopathy.  Neurological: She is alert and oriented to person, place, and time. She exhibits normal muscle tone. Coordination normal.  Skin: Skin is warm. No rash noted. She is not diaphoretic. No erythema. No pallor.  Psychiatric: She has a normal mood and affect. Her behavior is normal. Judgment and thought content normal.    Data Reviewed I reviewed Dr. Marzetta Board office notes, the ultrasound and biliary scan reports, the endoscopy report and photographs, the lab tests.     Assessment 3 month history of intermittent epigastric and right upper quadrant abdominal pain and nausea. History very consistent with biliary colic.   Workup to date is negative for biliary tract disease, esophageal or gastric disease.   Mild reflux symptoms, resolved following use of proton pump inhibitors, no evidence of esophagitis on endoscopy.   Status post laparoscopic tubal  ligation.   Plan   We had a very long discussion about her workup to date and her symptoms. I told her that cholecystectomy was a reasonable option at this point, but if the goal of care is complete resolution of her symptoms, the chance of that happening is about 50%.   We talked about differential diagnosis. The only other issue that I can think of is pancreatic. She was to be sure that she doesn't have pancreatic disease.   Before committing to a cholecystectomy, we're going to get a CT scan of the abdomen with pancreatic protocol, amylase, lipase, CEA, and CA 19-9.   We will discuss the results of these tests by phone after they are done. If they're negative then I'll offer her a laparoscopic cholecystectomy with cholangiogram.   I discussed the indications and details of cholecystectomy with her. Risks and complications were outlined, including but limited to bleeding, infection, conversion to open laparotomy, bile leak, wound healing problems, injury to adjacent organs such as the main bile duct or intestine with ajor reconstructive surgery, cardiac pulmonary and thromboembolic problems. She is also aware that her symptoms may not resolve following cholecystectomy because of the non-diagnostic workup. She seems to understand all these issues well.   her questions were answered. We will make  a decision about cholecystectomy after the above workup is completed.    ADDENDUM: CT scan of the abdomen shows no abnormalities of the pancreas liver gallbladder or adrenal glands. There is probably a small nonobstructing left upper pole renal calculus. No explanation for her abdominal pain. Laboratory including  amylase lipase CEA and CA 19-9 Are normal.   I called the patient today and discussed this with her. We talked about different strategies for care. She wants to go ahead and schedule for a laparoscopic cholecystectomy with cholangiogram. We will set this up for her.     Yasmin Dibello  M 05/24/2011, 11:57 AM             Previous Version          Not recorded       Discontinued Medications         Reason for Discontinue    0.9 %  sodium chloride infusion Error       Orders Placed This Encounter     Cancer antigen 19-9 [ZOX096 Custom]         Patient Instructions     I am suspicious that you have gallbladder disease and that gallbladder disease is causing your pain. The diagnosis is not confirmed after extensive workup. We talked about different approaches to your care. We decided to perform a CT scan of the abdomen with pancreatic protocol and some blood tests to make sure there is no disease of the pancreas. If all these tests are normal then it would be reasonable to proceed with laparoscopic cholecystectomy with cholangiogram. We will discuss this by phone after the tests are done. I would continue to take the dexilant and to adhere to a very low-fat diet. I would also recommend that you abstain from alcohol.       Level of Service     PR OFFICE CONSULTATION,LEVEL IV [04540]         All Flowsheet Templates (all recorded)     Encounter Vitals Flowsheet    Custom Formula Data Flowsheet    Anthropometrics Flowsheet               Referring Provider          Louis Meckel, MD       All Charges for This Encounter       Code Description Service Date Service Provider Modifiers Quantity    [phone] PR OFFICE CONSULTATION,LEVEL IV 05/24/2011 Ernestene Mention, MD   1        Other Encounter Related Information     Allergies & Medications         Problem List         History         Patient-Entered Questionnaires     No data filed

## 2011-06-13 ENCOUNTER — Other Ambulatory Visit (INDEPENDENT_AMBULATORY_CARE_PROVIDER_SITE_OTHER): Payer: Self-pay | Admitting: General Surgery

## 2011-06-13 ENCOUNTER — Encounter (HOSPITAL_COMMUNITY): Payer: Self-pay | Admitting: *Deleted

## 2011-06-13 ENCOUNTER — Encounter (HOSPITAL_COMMUNITY): Payer: Self-pay | Admitting: Anesthesiology

## 2011-06-13 ENCOUNTER — Ambulatory Visit (HOSPITAL_COMMUNITY)
Admission: RE | Admit: 2011-06-13 | Discharge: 2011-06-13 | Disposition: A | Discharge status: Home / Self Care | Payer: BC Managed Care – PPO | Source: Ambulatory Visit | Attending: General Surgery | Admitting: General Surgery

## 2011-06-13 ENCOUNTER — Ambulatory Visit (HOSPITAL_COMMUNITY): Payer: BC Managed Care – PPO | Admitting: Anesthesiology

## 2011-06-13 ENCOUNTER — Ambulatory Visit (HOSPITAL_COMMUNITY): Payer: BC Managed Care – PPO

## 2011-06-13 ENCOUNTER — Encounter (HOSPITAL_COMMUNITY)
Admission: RE | Disposition: A | Discharge status: Home / Self Care | Payer: Self-pay | Source: Ambulatory Visit | Attending: General Surgery

## 2011-06-13 DIAGNOSIS — K811 Chronic cholecystitis: Secondary | ICD-10-CM

## 2011-06-13 DIAGNOSIS — Z01812 Encounter for preprocedural laboratory examination: Secondary | ICD-10-CM | POA: Insufficient documentation

## 2011-06-13 DIAGNOSIS — K828 Other specified diseases of gallbladder: Secondary | ICD-10-CM | POA: Insufficient documentation

## 2011-06-13 DIAGNOSIS — R1013 Epigastric pain: Secondary | ICD-10-CM | POA: Insufficient documentation

## 2011-06-13 DIAGNOSIS — R110 Nausea: Secondary | ICD-10-CM | POA: Insufficient documentation

## 2011-06-13 HISTORY — PX: CHOLECYSTECTOMY: SHX55

## 2011-06-13 SURGERY — LAPAROSCOPIC CHOLECYSTECTOMY WITH INTRAOPERATIVE CHOLANGIOGRAM
Anesthesia: General | Site: Abdomen | Wound class: Clean Contaminated

## 2011-06-13 MED ORDER — NEOSTIGMINE METHYLSULFATE 1 MG/ML IJ SOLN
INTRAMUSCULAR | Status: DC | PRN
  Administered 2011-06-13: 4 mg via INTRAVENOUS

## 2011-06-13 MED ORDER — SODIUM CHLORIDE 0.9 % IR SOLN
Status: DC | PRN
  Administered 2011-06-13: 4 mL

## 2011-06-13 MED ORDER — DEXAMETHASONE SODIUM PHOSPHATE 4 MG/ML IJ SOLN
INTRAMUSCULAR | Status: DC | PRN
  Administered 2011-06-13: 10 mg via INTRAVENOUS

## 2011-06-13 MED ORDER — LACTATED RINGERS IR SOLN
Status: DC | PRN
  Administered 2011-06-13: 1000 mL

## 2011-06-13 MED ORDER — CEFAZOLIN SODIUM 1-5 GM-% IV SOLN
INTRAVENOUS | Status: DC | PRN
  Administered 2011-06-13: 1 g via INTRAVENOUS

## 2011-06-13 MED ORDER — IOHEXOL 300 MG/ML  SOLN
INTRAMUSCULAR | Status: DC | PRN
  Administered 2011-06-13: 4 mL

## 2011-06-13 MED ORDER — ROCURONIUM BROMIDE 100 MG/10ML IV SOLN
INTRAVENOUS | Status: DC | PRN
  Administered 2011-06-13: 40 mg via INTRAVENOUS

## 2011-06-13 MED ORDER — ACETAMINOPHEN 10 MG/ML IV SOLN
INTRAVENOUS | Status: DC | PRN
  Administered 2011-06-13: 1000 mg via INTRAVENOUS

## 2011-06-13 MED ORDER — SUFENTANIL CITRATE 50 MCG/ML IV SOLN
INTRAVENOUS | Status: DC | PRN
  Administered 2011-06-13: 10 ug via INTRAVENOUS
  Administered 2011-06-13: 5 ug via INTRAVENOUS
  Administered 2011-06-13: 10 ug via INTRAVENOUS
  Administered 2011-06-13: 5 ug via INTRAVENOUS

## 2011-06-13 MED ORDER — GLYCOPYRROLATE 0.2 MG/ML IJ SOLN
INTRAMUSCULAR | Status: DC | PRN
  Administered 2011-06-13: .6 mg via INTRAVENOUS

## 2011-06-13 MED ORDER — MIDAZOLAM HCL 5 MG/5ML IJ SOLN
INTRAMUSCULAR | Status: DC | PRN
  Administered 2011-06-13: 2 mg via INTRAVENOUS
  Administered 2011-06-13 (×2): 1 mg via INTRAVENOUS

## 2011-06-13 MED ORDER — LACTATED RINGERS IV SOLN
INTRAVENOUS | Status: DC | PRN
  Administered 2011-06-13 (×3): via INTRAVENOUS

## 2011-06-13 MED ORDER — PROMETHAZINE HCL 25 MG/ML IJ SOLN: 6.2500 mg | INTRAMUSCULAR | Status: DC | PRN

## 2011-06-13 MED ORDER — HYDROCODONE-ACETAMINOPHEN 5-325 MG PO TABS: 1.0000 | ORAL_TABLET | ORAL | Status: AC | PRN

## 2011-06-13 MED ORDER — ONDANSETRON HCL 4 MG/2ML IJ SOLN
INTRAMUSCULAR | Status: DC | PRN
  Administered 2011-06-13: 4 mg via INTRAVENOUS

## 2011-06-13 MED ORDER — PROPOFOL 10 MG/ML IV EMUL
INTRAVENOUS | Status: DC | PRN
  Administered 2011-06-13: 200 mg via INTRAVENOUS

## 2011-06-13 MED ORDER — BUPIVACAINE-EPINEPHRINE 0.5% -1:200000 IJ SOLN
INTRAMUSCULAR | Status: DC | PRN
  Administered 2011-06-13: 15 mL

## 2011-06-13 MED ORDER — LACTATED RINGERS IV SOLN
INTRAVENOUS | Status: DC
  Administered 2011-06-13: 1000 mL via INTRAVENOUS

## 2011-06-13 MED ORDER — LIDOCAINE HCL (CARDIAC) 20 MG/ML IV SOLN
INTRAVENOUS | Status: DC | PRN
  Administered 2011-06-13: 100 mg via INTRAVENOUS

## 2011-06-13 MED ORDER — HYDROMORPHONE HCL PF 1 MG/ML IJ SOLN
0.2500 mg | INTRAMUSCULAR | Status: DC | PRN
  Administered 2011-06-13: 0.5 mg via INTRAVENOUS

## 2011-06-13 MED ORDER — CEFAZOLIN SODIUM 1-5 GM-% IV SOLN: 1.0000 g | INTRAVENOUS | Status: DC

## 2011-06-13 SURGICAL SUPPLY — 37 items
ADH SKN CLS APL DERMABOND .7 (GAUZE/BANDAGES/DRESSINGS) ×1
APL SKNCLS STERI-STRIP NONHPOA (GAUZE/BANDAGES/DRESSINGS) ×1
APPLIER CLIP ROT 10 11.4 M/L (STAPLE) ×2
APR CLP MED LRG 11.4X10 (STAPLE) ×1
BAG SPEC RTRVL LRG 6X4 10 (ENDOMECHANICALS) ×1
BENZOIN TINCTURE PRP APPL 2/3 (GAUZE/BANDAGES/DRESSINGS) ×2 IMPLANT
CANISTER SUCTION 2500CC (MISCELLANEOUS) ×2 IMPLANT
CLIP APPLIE ROT 10 11.4 M/L (STAPLE) ×1 IMPLANT
CLOTH BEACON ORANGE TIMEOUT ST (SAFETY) ×2 IMPLANT
COVER MAYO STAND STRL (DRAPES) ×2 IMPLANT
DECANTER SPIKE VIAL GLASS SM (MISCELLANEOUS) ×2 IMPLANT
DERMABOND ADVANCED (GAUZE/BANDAGES/DRESSINGS) ×1
DERMABOND ADVANCED .7 DNX12 (GAUZE/BANDAGES/DRESSINGS) ×1 IMPLANT
DRAPE C-ARM 42X72 X-RAY (DRAPES) ×2 IMPLANT
DRAPE LAPAROSCOPIC ABDOMINAL (DRAPES) ×2 IMPLANT
ELECT REM PT RETURN 9FT ADLT (ELECTROSURGICAL) ×2
ELECTRODE REM PT RTRN 9FT ADLT (ELECTROSURGICAL) ×1 IMPLANT
GLOVE BIOGEL PI IND STRL 7.0 (GLOVE) ×1 IMPLANT
GLOVE BIOGEL PI INDICATOR 7.0 (GLOVE) ×1
GLOVE EUDERMIC 7 POWDERFREE (GLOVE) ×2 IMPLANT
GOWN STRL NON-REIN LRG LVL3 (GOWN DISPOSABLE) ×2 IMPLANT
GOWN STRL REIN XL XLG (GOWN DISPOSABLE) ×4 IMPLANT
HEMOSTAT SURGICEL 4X8 (HEMOSTASIS) IMPLANT
KIT BASIN OR (CUSTOM PROCEDURE TRAY) ×2 IMPLANT
NS IRRIG 1000ML POUR BTL (IV SOLUTION) ×2 IMPLANT
POUCH SPECIMEN RETRIEVAL 10MM (ENDOMECHANICALS) ×1 IMPLANT
SET CHOLANGIOGRAPH MIX (MISCELLANEOUS) ×2 IMPLANT
SET IRRIG TUBING LAPAROSCOPIC (IRRIGATION / IRRIGATOR) ×2 IMPLANT
SOLUTION ANTI FOG 6CC (MISCELLANEOUS) ×2 IMPLANT
STRIP CLOSURE SKIN 1/2X4 (GAUZE/BANDAGES/DRESSINGS) ×1 IMPLANT
SUT MNCRL AB 4-0 PS2 18 (SUTURE) ×2 IMPLANT
TOWEL OR 17X26 10 PK STRL BLUE (TOWEL DISPOSABLE) ×6 IMPLANT
TRAY LAP CHOLE (CUSTOM PROCEDURE TRAY) ×2 IMPLANT
TROCAR BLADELESS OPT 5 75 (ENDOMECHANICALS) ×2 IMPLANT
TROCAR XCEL BLUNT TIP 100MML (ENDOMECHANICALS) ×2 IMPLANT
TROCAR XCEL NON-BLD 11X100MML (ENDOMECHANICALS) ×1 IMPLANT
TUBING INSUFFLATION 10FT LAP (TUBING) ×2 IMPLANT

## 2011-06-13 NOTE — Transfer of Care (Signed)
Immediate Anesthesia Transfer of Care Note  Patient: Samantha Howell  Procedure(s) Performed:  LAPAROSCOPIC CHOLECYSTECTOMY WITH INTRAOPERATIVE CHOLANGIOGRAM  Patient Location: PACU  Anesthesia Type: General  Level of Consciousness: awake, alert , oriented and patient cooperative  Airway & Oxygen Therapy: Patient Spontanous Breathing and Patient connected to face mask oxygen  Post-op Assessment: Report given to PACU RN and Post -op Vital signs reviewed and stable  Post vital signs: stable  Complications: No apparent anesthesia complications

## 2011-06-13 NOTE — Anesthesia Postprocedure Evaluation (Signed)
  Anesthesia Post-op Note  Patient: Samantha Howell  Procedure(s) Performed:  LAPAROSCOPIC CHOLECYSTECTOMY WITH INTRAOPERATIVE CHOLANGIOGRAM  Patient Location: PACU  Anesthesia Type: General  Level of Consciousness: oriented and sedated  Airway and Oxygen Therapy: Patient Spontanous Breathing  Post-op Pain: mild  Post-op Assessment: Post-op Vital signs reviewed, Patient's Cardiovascular Status Stable, Respiratory Function Stable and Patent Airway  Post-op Vital Signs: stable  Complications: No apparent anesthesia complications

## 2011-06-13 NOTE — Op Note (Signed)
Laparoscopic Cholecystectomy with IOC Procedure Note  Indications: This patient presents with symptomatic gallbladder disease and will undergo laparoscopic cholecystectomy.  Pre-operative Diagnosis: Chronic cholecystitis  Post-operative Diagnosis: Chronic cholecystitis  Surgeon: Ernestene Mention   Assistants: Dr. Consuello Bossier  Anesthesia: General endotracheal anesthesia  ASA Class: 1  Procedure Details  The patient was seen again in the Holding Room. The risks, benefits, complications, treatment options, and expected outcomes were discussed with the patient. The possibilities of reaction to medication, pulmonary aspiration, perforation of viscus, bleeding, recurrent infection, finding a normal gallbladder, the need for additional procedures, failure to diagnose a condition, the possible need to convert to an open procedure, and creating a complication requiring transfusion or operation were discussed with the patient. The likelihood of improving the patient's symptoms with return to their baseline status is good.  The patient and/or family concurred with the proposed plan, giving informed consent. The site of surgery properly noted. The patient was taken to Operating Room, identified as Samantha Howell and the procedure verified as Laparoscopic Cholecystectomy with Intraoperative Cholangiogram. A Time Out was held and the above information confirmed.  Prior to the induction of general anesthesia, antibiotic prophylaxis was administered. General endotracheal anesthesia was then administered and tolerated well. After the induction, the abdomen was prepped with Chloraprep and draped in the sterile fashion. The patient was positioned in the supine position.  Local anesthetic agent was injected into the skin near the umbilicus and an infraumbilical incision made. We dissected down to the abdominal fascia with blunt dissection.  The fascia was incised vertically and we entered the peritoneal  cavity bluntly.  A pursestring suture of 0-Vicryl was placed around the fascial opening.  The Hasson cannula was inserted and secured with the stay suture.  Pneumoperitoneum was then created with CO2 and tolerated well without any adverse changes in the patient's vital signs. An 11-mm port was placed in the subxiphoid position.  Two 5-mm ports were placed in the right upper quadrant. All skin incisions were infiltrated with a local anesthetic agent before making the incision and placing the trocars.   We positioned the patient in reverse Trendelenburg, tilted slightly to the patient's left.  The gallbladder was identified, the fundus grasped and retracted cephalad. There were significant adhesions of the omentum to the lower body and infundibulum of the gallbladder. Adhesions were lysed bluntly and with the electrocautery where indicated, taking care not to injure any adjacent organs or viscus. The infundibulum was grasped and retracted laterally, exposing the peritoneum overlying the triangle of Calot. This was then divided and exposed in a blunt fashion. A critical view of the cystic duct and cystic artery was obtained.  The cystic duct was clearly identified and bluntly dissected circumferentially. The cystic duct was ligated with a clip distally.   An incision was made in the cystic duct and the [facility] cholangiogram catheter introduced. The catheter was secured using a clip. A cholangiogram was then obtained which showed good visualization of the distal and proximal biliary tree with no sign of filling defects or obstruction.  Contrast flowed easily into the duodenum. The catheter was then removed.   The cystic duct was then ligated with clips and divided. The cystic artery was identified, dissected free, ligated with clips and divided as well.   The gallbladder was dissected from the liver bed in retrograde fashion with the electrocautery. The gallbladder was removed and placed in an Endocatch sac. The  liver bed was irrigated and inspected. Hemostasis was achieved with  the electrocautery. Copious irrigation was utilized and was repeatedly aspirated until clear.  The gallbladder and Endocatch sac were then removed through the umbilical port site.  The pursestring suture was used to close the umbilical fascia.    We again inspected the right upper quadrant for hemostasis.  Pneumoperitoneum was released as we removed the trocars.  4-0 Monocryl was used to close the skin.  Dermabond was applied. The patient was then extubated and brought to the recovery room in stable condition. Instrument, sponge, and needle counts were correct at closure and at the conclusion of the case.   Findings: Cholecystitis without Cholelithiasis  Estimated Blood Loss: Minimal         Drains: none         Specimens: Gallbladder           Complications: None; patient tolerated the procedure well.         Disposition: PACU - hemodynamically stable.         Condition: stable

## 2011-06-13 NOTE — H&P (View-Only) (Signed)
 Samantha Howell   05/24/2011 11:00 AM Office Visit  MRN: [id number]   Description: 45 year old female  Provider: Rahil Passey M, MD  Department: [facility]        Diagnoses     Abdominal pain, epigastric   - Primary    789.06      Reason for Visit     Other    Eval possible cholecystectomy        Vitals - Last Recorded       BP Pulse Temp(Src) Resp Ht Wt    124/86  66  96.8 F (36 C) (Temporal)  14  5' (1.524 m)  138 lb 9.6 oz (62.869 kg)          BMI LMP            27.07 kg/m2  05/11/2011               Progress Notes     Cataleia Gade M, MD  05/29/2011  6:08 PM  AddendumChief Complaint   Patient presents with   .  Other       Eval possible cholecystectomy      HPI Samantha Howell is a 45 y.o. female.     This is a healthy, pleasant middle-aged woman who is referred to me by Dr. Robert Kaplan for evaluation of epigastric pain. It is strongly suspected this is gallbladder disease although workup is negative to date. Her primary care physician is Dr. Gutierrez at [facility].   The patient began having abdominal pain in July of 2012. She said  the first episode awoke her in the morning and was associated with right upper quadrant and epigastric pain and belching. It was very severe and lasted for hours and then resolved. It was so severe she states she could not breathe. She saw her primary care physician. She says he was suspicious of gallbladder disease. An ultrasound was performed which was normal. She was then referred to Dr. Robert Kaplan.   Since that time she has had  frequent intermittent episodes of right upper quadrant epigastric pain and nausea. She denies fever chills or vomiting. She'll develop diarrhea within the last week after having her endoscopy. She says the pain is exacerbated by meals and she has a fair amount of bloating. The pain will completely resolve only to recur later.   Hepatobiliary scan with ejection fraction was  performed on August 22 and that is normal. Liver function tests have been done repeatedly. The initial set of liver function test on July 23 showed a slight elevation of AST and ALT, but repeated sets of liver profile have been normal since. An upper endoscopy was performed on October 17 and that is completely normal. She has been on double dose proton pump inhibitors and actually she states that that has improved some reflux symptoms she has had but has not affected the pain at all.   She states that she is somewhat fearful of cancer. She denies any prior history of liver disease cardiovascular disease pulmonary disease or urologic problems. She has not had a CT scan.   The only surgery she says a tubal ligation in 1992 and a breast biopsy in 2000.   History is significant for cholecystectomy in the maternal grandmother and maternal grandfather but a no one else. She has a sister and nephew with Crohn's disease. She is in no distress today.   HPI    Past Medical History   Diagnosis    Date   .  Allergic rhinitis     .  Fibrocystic breast     .  GERD (gastroesophageal reflux disease)     .  Migraines     .  HLD (hyperlipidemia)     .  Abdominal pain     .  Nausea         Past Surgical History   Procedure  Date   .  Breast lumpectomy  12/00       Left breast   .  Tubal ligation  1992       Family History   Problem  Relation  Age of Onset   .  Heart attack  Father     .  Hyperlipidemia  Father     .  Hearing loss  Father     .  Early menopause  Mother     .  Breast cancer           MGGM   .  Ovarian cancer  Paternal Aunt     .  Cancer  Paternal Aunt         ovarian   .  Cancer  Maternal Grandmother         Bladder, breast   .  Crohn's disease  Sister        Social History History   Substance Use Topics   .  Smoking status:  Former Smoker       Quit date:  07/31/1996   .  Smokeless tobacco:  Never Used   .  Alcohol Use:  Yes         Occasional       Allergies     Allergen  Reactions   .  Penicillins  Nausea Only   .  Sulfonamide Derivatives  Hives       Happened when she was a child.       Current Outpatient Prescriptions   Medication  Sig  Dispense  Refill   .  cetirizine (ZYRTEC) 10 MG tablet  Take 10 mg by mouth daily.           .  Dexlansoprazole (DEXILANT) 30 MG capsule  Take 2 capsules (60 mg total) by mouth daily.   30 capsule   2   .  ibuprofen (ADVIL,MOTRIN) 200 MG tablet  Take 200 mg by mouth as needed.          .  norgestrel-ethinyl estradiol (OGESTROL) 0.5-50 MG-MCG tablet  Take 1 tablet by mouth daily.   3 Package   3       Current Facility-Administered Medications   Medication  Dose  Route  Frequency  Provider  Last Rate  Last Dose   .  DISCONTD: 0.9 %  sodium chloride infusion   500 mL  Intravenous  Continuous  Robert D Kaplan, MD            Review of Systems Review of Systems  Constitutional: Negative for fever, chills and unexpected weight change.  HENT: Negative for hearing loss, congestion, sore throat, trouble swallowing and voice change.   Eyes: Negative for visual disturbance.  Respiratory: Negative for cough and wheezing.   Cardiovascular: Negative for chest pain, palpitations and leg swelling.  Gastrointestinal: Positive for nausea, abdominal pain and diarrhea. Negative for vomiting, constipation, blood in stool, abdominal distention and anal bleeding.  Genitourinary: Negative for hematuria, vaginal bleeding and difficulty urinating.  Musculoskeletal: Negative for arthralgias.  Skin: Negative for rash and wound.    Neurological: Negative for seizures, syncope and headaches.  Hematological: Negative for adenopathy. Does not bruise/bleed easily.  Psychiatric/Behavioral: Negative for confusion.    Blood pressure 124/86, pulse 66, temperature 96.8 F (36 C), temperature source Temporal, resp. rate 14, height 5' (1.524 m), weight 138 lb 9.6 oz (62.869 kg), last menstrual period 05/11/2011.   Physical Exam Physical  Exam  Constitutional: She is oriented to person, place, and time. She appears well-developed and well-nourished. No distress.  HENT:   Head: Normocephalic and atraumatic.   Nose: Nose normal.   Mouth/Throat: No oropharyngeal exudate.  Eyes: Conjunctivae and EOM are normal. Pupils are equal, round, and reactive to light. Left eye exhibits no discharge. No scleral icterus.  Neck: Neck supple. No JVD present. No tracheal deviation present. No thyromegaly present.  Cardiovascular: Normal rate, regular rhythm, normal heart sounds and intact distal pulses.    No murmur heard. Pulmonary/Chest: Effort normal and breath sounds normal. No respiratory distress. She has no wheezes. She has no rales. She exhibits no tenderness.  Abdominal: Soft. Bowel sounds are normal. She exhibits no distension and no mass. There is tenderness. There is no rebound and no guarding.       Subjective epigastric tenderness. RUQ less tender. No mass. No distention.  Musculoskeletal: She exhibits no edema and no tenderness.  Lymphadenopathy:    She has no cervical adenopathy.  Neurological: She is alert and oriented to person, place, and time. She exhibits normal muscle tone. Coordination normal.  Skin: Skin is warm. No rash noted. She is not diaphoretic. No erythema. No pallor.  Psychiatric: She has a normal mood and affect. Her behavior is normal. Judgment and thought content normal.    Data Reviewed I reviewed Dr. Kaplan's office notes, the ultrasound and biliary scan reports, the endoscopy report and photographs, the lab tests.     Assessment 3 month history of intermittent epigastric and right upper quadrant abdominal pain and nausea. History very consistent with biliary colic.   Workup to date is negative for biliary tract disease, esophageal or gastric disease.   Mild reflux symptoms, resolved following use of proton pump inhibitors, no evidence of esophagitis on endoscopy.   Status post laparoscopic tubal  ligation.   Plan   We had a very long discussion about her workup to date and her symptoms. I told her that cholecystectomy was a reasonable option at this point, but if the goal of care is complete resolution of her symptoms, the chance of that happening is about 50%.   We talked about differential diagnosis. The only other issue that I can think of is pancreatic. She was to be sure that she doesn't have pancreatic disease.   Before committing to a cholecystectomy, we're going to get a CT scan of the abdomen with pancreatic protocol, amylase, lipase, CEA, and CA 19-9.   We will discuss the results of these tests by phone after they are done. If they're negative then I'll offer her a laparoscopic cholecystectomy with cholangiogram.   I discussed the indications and details of cholecystectomy with her. Risks and complications were outlined, including but limited to bleeding, infection, conversion to open laparotomy, bile leak, wound healing problems, injury to adjacent organs such as the main bile duct or intestine with ajor reconstructive surgery, cardiac pulmonary and thromboembolic problems. She is also aware that her symptoms may not resolve following cholecystectomy because of the non-diagnostic workup. She seems to understand all these issues well.   her questions were answered. We will make   a decision about cholecystectomy after the above workup is completed.    ADDENDUM: CT scan of the abdomen shows no abnormalities of the pancreas liver gallbladder or adrenal glands. There is probably a small nonobstructing left upper pole renal calculus. No explanation for her abdominal pain. Laboratory including  amylase lipase CEA and CA 19-9 Are normal.   I called the patient today and discussed this with her. We talked about different strategies for care. She wants to go ahead and schedule for a laparoscopic cholecystectomy with cholangiogram. We will set this up for her.     Meghna Hagmann  M 05/24/2011, 11:57 AM             Previous Version          Not recorded       Discontinued Medications         Reason for Discontinue    0.9 %  sodium chloride infusion Error       Orders Placed This Encounter     Cancer antigen 19-9 [LAB777 Custom]         Patient Instructions     I am suspicious that you have gallbladder disease and that gallbladder disease is causing your pain. The diagnosis is not confirmed after extensive workup. We talked about different approaches to your care. We decided to perform a CT scan of the abdomen with pancreatic protocol and some blood tests to make sure there is no disease of the pancreas. If all these tests are normal then it would be reasonable to proceed with laparoscopic cholecystectomy with cholangiogram. We will discuss this by phone after the tests are done. I would continue to take the dexilant and to adhere to a very low-fat diet. I would also recommend that you abstain from alcohol.       Level of Service     PR OFFICE CONSULTATION,LEVEL IV [99244]         All Flowsheet Templates (all recorded)     Encounter Vitals Flowsheet    Custom Formula Data Flowsheet    Anthropometrics Flowsheet               Referring Provider          Robert D Kaplan, MD       All Charges for This Encounter       Code Description Service Date Service Provider Modifiers Quantity    99244 PR OFFICE CONSULTATION,LEVEL IV 05/24/2011 Makaia Rappa M Jeanmarc Viernes, MD   1        Other Encounter Related Information     Allergies & Medications         Problem List         History         Patient-Entered Questionnaires     No data filed         

## 2011-06-13 NOTE — Anesthesia Preprocedure Evaluation (Signed)
Anesthesia Evaluation  Patient identified by MRN, date of birth, ID band Patient awake    Reviewed: Allergy & Precautions, H&P , NPO status , Patient's Chart, lab work & pertinent test results, reviewed documented beta blocker date and time   Airway Mallampati: I TM Distance: >3 FB     Dental  (+) Teeth Intact   Pulmonary neg pulmonary ROS,  clear to auscultation        Cardiovascular neg cardio ROS Regular Normal Denies cardiac symptoms   Neuro/Psych Negative Neurological ROS  Negative Psych ROS   GI/Hepatic Neg liver ROS, Biliary dyskenesia   Endo/Other  Negative Endocrine ROS  Renal/GU negative Renal ROS  Genitourinary negative   Musculoskeletal negative musculoskeletal ROS (+)   Abdominal   Peds negative pediatric ROS (+)  Hematology negative hematology ROS (+)   Anesthesia Other Findings Caps in back  Reproductive/Obstetrics negative OB ROS                           Anesthesia Physical Anesthesia Plan  ASA: I  Anesthesia Plan: General   Post-op Pain Management:    Induction: Intravenous  Airway Management Planned: Oral ETT  Additional Equipment:   Intra-op Plan:   Post-operative Plan: Extubation in OR  Informed Consent: I have reviewed the patients History and Physical, chart, labs and discussed the procedure including the risks, benefits and alternatives for the proposed anesthesia with the patient or authorized representative who has indicated his/her understanding and acceptance.     Plan Discussed with: CRNA and Surgeon  Anesthesia Plan Comments:         Anesthesia Quick Evaluation

## 2011-06-13 NOTE — Preoperative (Signed)
Beta Blockers   Reason not to administer Beta Blockers:Not Applicable 

## 2011-06-13 NOTE — Interval H&P Note (Signed)
History and Physical Interval Note:   06/13/2011   11:59 AM   Samantha Howell  has presented today for surgery, with the diagnosis of biliary dyskinesia  The various methods of treatment have been discussed with the patient and family. After consideration of risks, benefits and other options for treatment, the patient has consented to  Procedure(s): LAPAROSCOPIC CHOLECYSTECTOMY WITH INTRAOPERATIVE CHOLANGIOGRAM as a surgical intervention .  The patients' history has been reviewed, patient examined, no change in status, stable for surgery.  I have reviewed the patients' chart and labs.  Questions were answered to the patient's satisfaction.     Ernestene Mention  MD

## 2011-06-15 ENCOUNTER — Telehealth (INDEPENDENT_AMBULATORY_CARE_PROVIDER_SITE_OTHER): Payer: Self-pay

## 2011-06-15 ENCOUNTER — Encounter (HOSPITAL_COMMUNITY): Payer: Self-pay | Admitting: General Surgery

## 2011-06-15 NOTE — Telephone Encounter (Signed)
Pt home doing well. Reminded of 12-3 ov.

## 2011-06-19 ENCOUNTER — Telehealth (INDEPENDENT_AMBULATORY_CARE_PROVIDER_SITE_OTHER): Payer: Self-pay | Admitting: General Surgery

## 2011-06-19 NOTE — Telephone Encounter (Signed)
Would like to know if Dr. Derrell Lolling could give her a Rx for muscle relaxer, she has a constant pain on her back, please call.

## 2011-06-19 NOTE — Telephone Encounter (Signed)
I received msg re:pt request for muscle relaxer. I called pt to review symptoms. Pt states area of discomfort is at spine on left upper back below scapula. Pt state pain is constant but improves when she moves arm and shoulder. No SOB,no pain with inspiration,no fever. Eating well. Voiding well. BMs normal. Pt is currently using advil and heating pad. Reviewed with Dr Derrell Lolling via phone. Pt advised if SOB or pain with deep breath she needs to call asap to r/o PE otherwise pt to see her PCP for her request for muscle relaxer per Dr Derrell Lolling.

## 2011-06-23 ENCOUNTER — Encounter: Payer: Self-pay | Admitting: Family Medicine

## 2011-06-23 ENCOUNTER — Ambulatory Visit (INDEPENDENT_AMBULATORY_CARE_PROVIDER_SITE_OTHER): Payer: BC Managed Care – PPO | Admitting: Family Medicine

## 2011-06-23 VITALS — BP 118/80 | HR 97 | Temp 98.8°F | Ht 60.0 in | Wt 136.8 lb

## 2011-06-23 DIAGNOSIS — M549 Dorsalgia, unspecified: Secondary | ICD-10-CM

## 2011-06-23 MED ORDER — CYCLOBENZAPRINE HCL 10 MG PO TABS: 10.0000 mg | ORAL_TABLET | Freq: Three times a day (TID) | ORAL | Status: AC | PRN

## 2011-06-23 MED ORDER — TRAMADOL HCL 50 MG PO TABS: 50.0000 mg | ORAL_TABLET | Freq: Four times a day (QID) | ORAL | Status: DC | PRN

## 2011-06-23 NOTE — Progress Notes (Signed)
  Patient Name: Samantha Howell Date of Birth: November 09, 1965 Age: 45 y.o. Medical Record Number: [id number] Gender: female  History of Present Illness:  Samantha Howell is a 45 y.o. very pleasant female patient who presents with the following:  10 days s/p lap chole, has been taking tylenol. Pain pill and that did not help.  Switched and took some advil. And was doing some light exercises and had some problems with stomach with the advil. Wed seemed to get a lot better, and then when stomach got better. Gradually has startedx to improve. Now lower back has gotten tight.  Primarily though, this is all in her mid back. No radiculopathy or neurological complaints.  Past Medical History, Surgical History, Social History, Family History, and Problem List have been reviewed in EHR and updated if relevant.  Review of Systems:  GEN: No fevers, chills. Nontoxic. Primarily MSK c/o today. MSK: Detailed in the HPI GI: tolerating PO intake without difficulty Neuro: No numbness, parasthesias, or tingling associated. Otherwise the pertinent positives of the ROS are noted above.    Physical Examination: Filed Vitals:   06/23/11 1042  BP: 118/80  Pulse: 97  Temp: 98.8 F (37.1 C)  TempSrc: Oral  Height: 5' (1.524 m)  Weight: 136 lb 12.8 oz (62.052 kg)  SpO2: 99%     GEN: Well-developed,well-nourished,in no acute distress; alert,appropriate and cooperative throughout examination HEENT: Normocephalic and atraumatic without obvious abnormalities. Ears, externally no deformities PULM: Breathing comfortably in no respiratory distress Abdomen shows clear dry and intact healing laparoscopy scars. Abdomen is grossly minimally tender and appropriate in his postoperative period. Hyperactive bowel sounds. EXT: No clubbing, cyanosis, or edema PSYCH: Normally interactive. Cooperative during the interview. Pleasant. Friendly and conversant. Not anxious or depressed appearing. Normal, full affect.  Range of  motion at  the waist: Flexion: normal Extension: normal Lateral bending: normal Rotation: all normal  No echymosis or edema Rises to examination table with no difficulty Gait: non antalgic  Inspection/Deformity: N Paraspinus Tenderness: none in the lower back, the patient does have some significant tenderness in her lower trapezius region as well as in the rhomboid region on both sides.  B Ankle Dorsiflexion (L5,4): 5/5 B Great Toe Dorsiflexion (L5,4): 5/5 Heel Walk (L5): WNL Toe Walk (S1): WNL Rise/Squat (L4): WNL  SENSORY B Medial Foot (L4): WNL B Dorsum (L5): WNL B Lateral (S1): WNL Light Touch: WNL Pinprick: WNL  REFLEXES Knee (L4): 2+ Ankle (S1): 2+  B SLR, seated: neg B SLR, supine: neg B Greater Troch: NT B Log Roll: neg B Stork: NT B Sciatic Notch: NT   Assessment and Plan: 1. Mid back pain  cyclobenzaprine (FLEXERIL) 10 MG tablet, traMADol (ULTRAM) 50 MG tablet    I reassured the patient. I think that this is all secondary due to scapular compensation from recent abdominal surgery. We reviewed some basic trapezius, rhomboid stretches as well as some scapular stabilization. I would avoid anti-inflammatories, and stick to Tylenol, and tramadol as needed for pain. Also gave her some Flexeril that she can use at nighttime.  She also asked about potential insomnia medications. She has tried some Benadryl and over-the-counter things, without much success. A long-term, this is better discussed with her regular doctor. In the time being, discussed her she could use her Flexeril at nighttime which would likely help her sleep.

## 2011-07-03 ENCOUNTER — Encounter (INDEPENDENT_AMBULATORY_CARE_PROVIDER_SITE_OTHER): Payer: Self-pay | Admitting: General Surgery

## 2011-07-03 ENCOUNTER — Ambulatory Visit (INDEPENDENT_AMBULATORY_CARE_PROVIDER_SITE_OTHER): Payer: BC Managed Care – PPO | Admitting: General Surgery

## 2011-07-03 VITALS — BP 118/84 | HR 64 | Temp 98.6°F | Resp 16 | Ht 60.0 in | Wt 139.5 lb

## 2011-07-03 DIAGNOSIS — Z9889 Other specified postprocedural states: Secondary | ICD-10-CM

## 2011-07-03 NOTE — Progress Notes (Signed)
Subjective:     Patient ID: Samantha Howell, female   DOB: 16-Dec-1965, 45 y.o.   MRN: [id number]  HPI  This patient underwent laparoscopic cholecystectomy with cholangiogram on June 13, 2011. Preop evaluation revealed a good history for biliary colic but negative objective  findings. Final pathology report showed chronic cholecystitis with cholesterolosis, but no stones.  Symptomatically the patient has done great. She has no pain no nausea has had good relief of symptoms and is very pleased with her progress to date. She occasionally would have some diarrhea but it usually has normal bowel movements. No problems with wound healing. Review of Systems     Objective:   Physical Exam The patient looks well and is in good spirits.  Abdomen soft and nontender. Wounds are all well healed.   Assessment:     Chronic cholecystitis. Excellent relief of symptoms in the early postop period  Preop reflux symptoms, on proton pump inhibitors.   Plan:     Low-fat diet is advised.  OK  to resume normal physical activities without restriction.  In terms of her reflux symptoms, I told her it would be reasonable to taper off the proton pump inhibitors and see if the reflux symptoms recur. If his symptoms do recur then I  advised to go back on proton pump inhibitors and discuss this with Dr. Melvia Heaps. Return to see me p.r.n.

## 2011-07-03 NOTE — Patient Instructions (Signed)
You are doing very well following your cholecystectomy. The final pathology report shows chronic cholecystitis and cholesterol deposits in the wall of the gallbladder. You have been given a copy of the pathology report. I advise a low fat diet. You should consider tapering off the proton pump inhibitors. If the heartburn symptoms return  you should discuss this with Dr. Melvia Heaps. Return to see me if new problems arise.

## 2011-09-26 ENCOUNTER — Encounter: Payer: Self-pay | Admitting: Internal Medicine

## 2011-09-26 ENCOUNTER — Ambulatory Visit (INDEPENDENT_AMBULATORY_CARE_PROVIDER_SITE_OTHER): Payer: BC Managed Care – PPO | Admitting: Internal Medicine

## 2011-09-26 VITALS — BP 128/90 | HR 103 | Temp 97.8°F | Wt 141.0 lb

## 2011-09-26 DIAGNOSIS — J209 Acute bronchitis, unspecified: Secondary | ICD-10-CM | POA: Insufficient documentation

## 2011-09-26 MED ORDER — DOXYCYCLINE HYCLATE 100 MG PO TABS: 100.0000 mg | ORAL_TABLET | Freq: Two times a day (BID) | ORAL | Status: AC

## 2011-09-26 MED ORDER — BENZONATATE 200 MG PO CAPS: 200.0000 mg | ORAL_CAPSULE | Freq: Three times a day (TID) | ORAL | Status: AC | PRN

## 2011-09-26 NOTE — Progress Notes (Signed)
Subjective:    Patient ID: Samantha Howell, female    DOB: June 22, 1966, 46 y.o.   MRN: [id number]  HPI Has been sick for about 5 days Coarse cough Pressure in nose and head Sore throat and headache  No fever Some SOB but mostly due to nasal congestion and trying to breathe shallow to avoid cough Occ mucus--mostly clear with slight green No ear pain--just itchy  mucinex--not really helpful  Current Outpatient Prescriptions on File Prior to Visit  Medication Sig Dispense Refill  . acetaminophen (TYLENOL) 500 MG tablet Take 1,000 mg by mouth every 6 (six) hours as needed.        . cetirizine (ZYRTEC) 10 MG tablet Take 10 mg by mouth daily.       . Multiple Vitamins-Minerals (MULTIVITAMIN WITH MINERALS) tablet Take 1 tablet by mouth daily.       . norgestrel-ethinyl estradiol (OGESTROL) 0.5-50 MG-MCG tablet Take 1 tablet by mouth daily.         Allergies  Allergen Reactions  . Penicillins Nausea Only  . Sulfonamide Derivatives Hives    Happened when she was a child.    Past Medical History  Diagnosis Date  . Allergic rhinitis   . Fibrocystic breast   . GERD (gastroesophageal reflux disease)   . Migraines   . HLD (hyperlipidemia)   . Abdominal pain   . Nausea     Past Surgical History  Procedure Date  . Breast lumpectomy 12/00    Left breast  . Tubal ligation 1992  . Cholecystectomy 06/13/2011    Procedure: LAPAROSCOPIC CHOLECYSTECTOMY WITH INTRAOPERATIVE CHOLANGIOGRAM;  Surgeon: Ernestene Mention, MD;  Location: [facility];  Service: General;  Laterality: N/A;    Family History  Problem Relation Age of Onset  . Heart attack Father   . Hyperlipidemia Father   . Hearing loss Father   . Early menopause Mother   . Breast cancer      MGGM  . Ovarian cancer Paternal Aunt   . Cancer Paternal Aunt     ovarian  . Cancer Maternal Grandmother     Bladder, breast  . Crohn's disease Sister     History   Social History  . Marital Status: Married    Spouse Name: N/A   Number of Children: 2  . Years of Education: N/A   Occupational History  . Customer Service [employer]   Social History Main Topics  . Smoking status: Former Smoker    Quit date: 07/31/1996  . Smokeless tobacco: Never Used  . Alcohol Use: Yes     Occasional  . Drug Use: No  . Sexually Active: Not on file   Other Topics Concern  . 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 No distress.       Coarse cough  HENT:  Mouth/Throat: Oropharynx is clear and moist. No oropharyngeal exudate.       No sinus tenderness Mild nasal inflammation TMs normal  Neck: Normal range of motion.  Pulmonary/Chest: Effort normal and breath sounds normal. No respiratory distress. She has no wheezes. She has no rales.       Tight cough but no prolonged exp phase  Lymphadenopathy:    She has no cervical adenopathy.  Assessment & Plan:

## 2011-09-26 NOTE — Assessment & Plan Note (Signed)
Probably viral Will try benzonatate Doxycycline if worsens

## 2011-09-26 NOTE — Patient Instructions (Signed)
Please try melatonin 3-5mg  at bedtime to help sleep Start the antibiotic (doxycycline) if you are worsening as the week goes on

## 2012-04-24 ENCOUNTER — Ambulatory Visit (INDEPENDENT_AMBULATORY_CARE_PROVIDER_SITE_OTHER): Payer: BC Managed Care – PPO | Admitting: Internal Medicine

## 2012-04-24 ENCOUNTER — Encounter: Payer: Self-pay | Admitting: Internal Medicine

## 2012-04-24 VITALS — BP 130/90 | HR 78 | Temp 97.7°F | Ht 60.0 in | Wt 140.0 lb

## 2012-04-24 DIAGNOSIS — R5383 Other fatigue: Secondary | ICD-10-CM

## 2012-04-24 DIAGNOSIS — R5381 Other malaise: Secondary | ICD-10-CM

## 2012-04-24 DIAGNOSIS — G479 Sleep disorder, unspecified: Secondary | ICD-10-CM

## 2012-04-24 MED ORDER — LEVONORGESTREL-ETHINYL ESTRAD 0.15-30 MG-MCG PO TABS: 1.0000 | ORAL_TABLET | Freq: Every day | ORAL | Status: DC

## 2012-04-24 MED ORDER — TEMAZEPAM 15 MG PO CAPS: 15.0000 mg | ORAL_CAPSULE | Freq: Every evening | ORAL | Status: DC | PRN

## 2012-04-24 NOTE — Assessment & Plan Note (Signed)
With headaches, etc May be related to sleep problems BP is borderline but not really elevated---wouldn't want to start meds for this Concerned about the hormone dose also For now---will check labs Decrease estrogen to dose for now

## 2012-04-24 NOTE — Progress Notes (Signed)
Subjective:    Patient ID: Samantha Howell, female    DOB: 11-Jun-1966, 46 y.o.   MRN: [id number]  HPI Has been checking her own BP with arm cuff 130-135/88-92 She can feel that it is higher ---headache, lightheaded and queasy feeling "I just don't feel right"  Has gone up a bit in the past Usually exercise would control it Formerly would get [facility]---hasn't had the energy for this due to poor sleep  Sleep problems for a couple of years No response with melatonin Occ good night, others she either can't initiate or awakens after just a few hours Lots of stress--long hours, computer and reading are straining eyes, etc Doesn't feel depressed  Wants to go off OCP Used for abnormal bleeding but also was very hormone sensitive  Current Outpatient Prescriptions on File Prior to Visit  Medication Sig Dispense Refill  . norgestrel-ethinyl estradiol (OGESTROL) 0.5-50 MG-MCG tablet Take 1 tablet by mouth daily.         Allergies  Allergen Reactions  . Penicillins Nausea Only  . Sulfonamide Derivatives Hives    Happened when she was a child.    Past Medical History  Diagnosis Date  . Allergic rhinitis   . Fibrocystic breast   . GERD (gastroesophageal reflux disease)   . Migraines   . HLD (hyperlipidemia)   . Abdominal pain   . Nausea     Past Surgical History  Procedure Date  . Breast lumpectomy 12/00    Left breast  . Tubal ligation 1992  . Cholecystectomy 06/13/2011    Procedure: LAPAROSCOPIC CHOLECYSTECTOMY WITH INTRAOPERATIVE CHOLANGIOGRAM;  Surgeon: Ernestene Mention, MD;  Location: [facility];  Service: General;  Laterality: N/A;    Family History  Problem Relation Age of Onset  . Heart attack Father   . Hyperlipidemia Father   . Hearing loss Father   . Early menopause Mother   . Breast cancer      MGGM  . Ovarian cancer Paternal Aunt   . Cancer Paternal Aunt     ovarian  . Cancer Maternal Grandmother     Bladder, breast  . Crohn's disease Sister       History   Social History  . Marital Status: Married    Spouse Name: N/A    Number of Children: 2  . Years of Education: N/A   Occupational History  . Customer Service [employer]   Social History Main Topics  . Smoking status: Former Smoker    Quit date: 07/31/1996  . Smokeless tobacco: Never Used  . Alcohol Use: Yes     Occasional  . Drug Use: No  . Sexually Active: Not on file   Other Topics Concern  . 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 No distress.  HENT:  Mouth/Throat: Oropharynx is clear and moist. No oropharyngeal exudate.  Neck: Normal range of motion. Neck supple. No tracheal deviation present. No thyromegaly present.  Cardiovascular: Normal rate, regular rhythm and normal heart sounds.  Exam reveals no gallop.   No murmur heard. Pulmonary/Chest: Effort normal and breath sounds normal. No respiratory distress. She has no wheezes. She has no rales.  Abdominal: Soft. There is no tenderness.  Musculoskeletal: She exhibits no edema and no tenderness.  Lymphadenopathy:    She has no cervical adenopathy.  Psychiatric: She has a normal mood and affect. Her behavior is normal.          Assessment & Plan:

## 2012-04-24 NOTE — Assessment & Plan Note (Signed)
Goes back a couple of years but now worse Can't get up to exercise--could be causing increased BP, etc Melatonin no help Discussed trazodone---will consider Will try temazepam prn (not nightly)

## 2012-04-25 LAB — CBC WITH DIFFERENTIAL/PLATELET
Eos: 3 % (ref 0–5)
HCT: 41.5 % (ref 34.0–46.6)
Hemoglobin: 13.5 g/dL (ref 11.1–15.9)
Immature Granulocytes: 0 % (ref 0–2)
Lymphocytes Absolute: 2.3 10*3/uL (ref 0.7–3.1)
MCV: 85 fL (ref 79–97)
Monocytes Absolute: 0.5 10*3/uL (ref 0.1–0.9)
Neutrophils Absolute: 3 10*3/uL (ref 1.4–7.0)
RDW: 15.2 % (ref 12.3–15.4)
WBC: 6.1 10*3/uL (ref 3.4–10.8)

## 2012-04-25 LAB — BASIC METABOLIC PANEL
GFR calc Af Amer: 103 mL/min/{1.73_m2} (ref >59)
GFR calc non Af Amer: 89 mL/min/{1.73_m2} (ref >59)
Glucose: 104 mg/dL — ABNORMAL HIGH (ref 65–99)
Potassium: 3.7 mmol/L (ref 3.5–5.2)
Sodium: 139 mmol/L (ref 134–144)

## 2012-04-25 LAB — HEPATIC FUNCTION PANEL
AST: 19 IU/L (ref 0–40)
Albumin: 4.1 g/dL (ref 3.5–5.5)
Alkaline Phosphatase: 44 IU/L (ref 42–107)
Bilirubin, Direct: 0.04 mg/dL (ref 0.00–0.40)
Total Bilirubin: 0.1 mg/dL (ref 0.0–1.2)

## 2012-04-26 ENCOUNTER — Other Ambulatory Visit: Payer: Self-pay | Admitting: Internal Medicine

## 2012-04-26 DIAGNOSIS — Z1231 Encounter for screening mammogram for malignant neoplasm of breast: Secondary | ICD-10-CM

## 2012-04-29 ENCOUNTER — Encounter: Payer: Self-pay | Admitting: *Deleted

## 2012-05-25 ENCOUNTER — Ambulatory Visit (INDEPENDENT_AMBULATORY_CARE_PROVIDER_SITE_OTHER): Payer: BC Managed Care – PPO | Admitting: Family Medicine

## 2012-05-25 ENCOUNTER — Encounter: Payer: Self-pay | Admitting: Family Medicine

## 2012-05-25 VITALS — BP 124/84 | HR 80 | Temp 97.8°F | Wt 140.0 lb

## 2012-05-25 DIAGNOSIS — R300 Dysuria: Secondary | ICD-10-CM

## 2012-05-25 LAB — POCT URINALYSIS DIPSTICK
Bilirubin, UA: NEGATIVE
Blood, UA: NEGATIVE
Ketones, UA: NEGATIVE
Protein, UA: NEGATIVE
Spec Grav, UA: 1.015
pH, UA: 7

## 2012-05-25 MED ORDER — CIPROFLOXACIN HCL 500 MG PO TABS: 500.0000 mg | ORAL_TABLET | Freq: Two times a day (BID) | ORAL | Status: DC

## 2012-05-25 NOTE — Progress Notes (Signed)
SUBJECTIVE: Samantha Howell is a very pleasant 46 y.o. female who complains of urinary frequency, urgency and dysuria x 14 days, without flank pain, fever, chills, or abnormal vaginal discharge or bleeding.   She does not have a h/o recurrent UTIs.  PMH significant for allergy to PCN and sulfa.  Patient Active Problem List  Diagnosis  . HYPERLIPIDEMIA  . ANEMIA  . HEARING LOSS  . ALLERGIC RHINITIS  . GERD  . FIBROCYSTIC BREAST DISEASE  . PREMENSTRUAL DYSPHORIC SYNDROME  . Routine general medical examination at a health care facility  . Fatigue  . Sleep disturbance, unspecified   Past Medical History  Diagnosis Date  . Allergic rhinitis   . Fibrocystic breast   . GERD (gastroesophageal reflux disease)   . Migraines   . HLD (hyperlipidemia)   . Abdominal pain   . Nausea    Past Surgical History  Procedure Date  . Breast lumpectomy 12/00    Left breast  . Tubal ligation 1992  . Cholecystectomy 06/13/2011    Procedure: LAPAROSCOPIC CHOLECYSTECTOMY WITH INTRAOPERATIVE CHOLANGIOGRAM;  Surgeon: Ernestene Mention, MD;  Location: [facility];  Service: General;  Laterality: N/A;   History  Substance Use Topics  . Smoking status: Former Smoker    Quit date: 07/31/1996  . Smokeless tobacco: Never Used  . Alcohol Use: Yes     Occasional   Family History  Problem Relation Age of Onset  . Heart attack Father   . Hyperlipidemia Father   . Hearing loss Father   . Early menopause Mother   . Breast cancer      MGGM  . Ovarian cancer Paternal Aunt   . Cancer Paternal Aunt     ovarian  . Cancer Maternal Grandmother     Bladder, breast  . Crohn's disease Sister    Allergies  Allergen Reactions  . Penicillins Nausea Only  . Sulfonamide Derivatives Hives    Happened when she was a child.   Current Outpatient Prescriptions on File Prior to Visit  Medication Sig Dispense Refill  . levonorgestrel-ethinyl estradiol (NORDETTE) 0.15-30 MG-MCG tablet Take 1 tablet by mouth daily.  1  Package  11  . temazepam (RESTORIL) 15 MG capsule Take 1-2 capsules (15-30 mg total) by mouth at bedtime as needed for sleep.  60 capsule  0   The PMH, PSH, Social History, Family History, Medications, and allergies have been reviewed in [facility], and have been updated if relevant.   OBJECTIVE:  BP 124/84  Pulse 80  Temp 97.8 F (36.6 C) (Oral)  Wt 140 lb (63.504 kg)  Appears well, in no apparent distress.  Vital signs are normal. The abdomen is soft without tenderness, guarding, mass, rebound or organomegaly. No CVA tenderness or inguinal adenopathy noted. Urine dipstick shows positive for nitrates and positive for leukocytes.    ASSESSMENT: UTI uncomplicated without evidence of pyelonephritis  PLAN: Treatment per orders - cipro 500 mg twice daily x 3 days, send urine for cx, also push fluids, may use Pyridium OTC prn. Call or return to clinic prn if these symptoms worsen or fail to improve as anticipated.

## 2012-05-25 NOTE — Patient Instructions (Addendum)
Nice to meet you. Take cipro as directed- 1 tablet twice daily x 3 days.  We will call you with your culture results.

## 2012-05-27 ENCOUNTER — Encounter: Payer: Self-pay | Admitting: Family Medicine

## 2012-05-27 ENCOUNTER — Telehealth: Payer: Self-pay | Admitting: Internal Medicine

## 2012-05-27 ENCOUNTER — Other Ambulatory Visit: Payer: Self-pay | Admitting: Family Medicine

## 2012-05-27 LAB — URINE CULTURE

## 2012-05-27 MED ORDER — CIPROFLOXACIN HCL 500 MG PO TABS: 500.0000 mg | ORAL_TABLET | Freq: Two times a day (BID) | ORAL | Status: DC

## 2012-05-27 NOTE — Telephone Encounter (Signed)
Seen already;

## 2012-05-27 NOTE — Telephone Encounter (Signed)
[facility] Triage Call Report Triage Record Num: [id number] Operator: Tarri Glenn Patient Name: Samantha Howell Call Date & Time: 05/25/2012 8:51:54AM Patient Phone: [phone] PCP: Tillman Abide Patient Gender: Female PCP Fax : [phone] Patient DOB: 1965/08/30 Practice Name: [facility] Reason for Call: Caller: [name]/Patient; PCP: Tillman Abide (Family Practice); CB#: [phone]; Call regarding Urinary Pain/Bleeding; LMP 05/18/12. Patient is calling about blood in urine, pressure, burning and pain. Patient started with AZO pills 05/24/12 with some relief. Onset 05/18/12. Afebrile. All emergent signs/sx r/o with exception to has one or more urinary tract symptoms and has not been previously evaluated per [facility] protocol. Appointment given for 05/25/12 @ 1130 at [place] office per see provider within 24 hours disposition. Protocol(s) Used: [facility] Recommended Outcome per Protocol: See Provider within 24 hours Reason for Outcome: Has one or more urinary tract symptoms AND has not been previously evaluated Care Advice: ~ 05/25/2012 9:01:48AM Page 1 of 1 [id number]

## 2012-05-28 MED ORDER — CIPROFLOXACIN HCL 500 MG PO TABS: 500.0000 mg | ORAL_TABLET | Freq: Two times a day (BID) | ORAL | Status: DC

## 2012-05-31 ENCOUNTER — Ambulatory Visit
Admission: RE | Admit: 2012-05-31 | Discharge: 2012-05-31 | Disposition: A | Discharge status: Home / Self Care | Payer: BC Managed Care – PPO | Source: Ambulatory Visit | Attending: Internal Medicine | Admitting: Internal Medicine

## 2012-05-31 DIAGNOSIS — Z1231 Encounter for screening mammogram for malignant neoplasm of breast: Secondary | ICD-10-CM

## 2012-06-19 ENCOUNTER — Encounter: Payer: Self-pay | Admitting: Internal Medicine

## 2012-06-19 ENCOUNTER — Ambulatory Visit (INDEPENDENT_AMBULATORY_CARE_PROVIDER_SITE_OTHER): Payer: BC Managed Care – PPO | Admitting: Internal Medicine

## 2012-06-19 VITALS — BP 120/80 | HR 76 | Temp 98.4°F | Wt 141.0 lb

## 2012-06-19 DIAGNOSIS — R5381 Other malaise: Secondary | ICD-10-CM

## 2012-06-19 DIAGNOSIS — R5383 Other fatigue: Secondary | ICD-10-CM

## 2012-06-19 NOTE — Assessment & Plan Note (Signed)
Has improved  Sleeping better without meds Wonders about the OCP---discussed that she can just stop if she wants If she has problems (like irregular bleeding), she can restart and we could consider decreasing the estrogen further next year Counseled all of 15 minute visit

## 2012-06-19 NOTE — Progress Notes (Signed)
  Subjective:    Patient ID: Samantha Howell, female    DOB: June 10, 1966, 46 y.o.   MRN: [id number]  HPI Doing better BP has been done---and she hasn't felt like her BP is as high  Sleeping better Hasn't needed the med since the first day  On lower hormone dose Lighter period and no breakthough No mood changes before periods ---actually mild irritability when she restarts her pills now  Current Outpatient Prescriptions on File Prior to Visit  Medication Sig Dispense Refill  . levonorgestrel-ethinyl estradiol (NORDETTE) 0.15-30 MG-MCG tablet Take 1 tablet by mouth daily.  1 Package  11  . temazepam (RESTORIL) 15 MG capsule Take 1-2 capsules (15-30 mg total) by mouth at bedtime as needed for sleep.  60 capsule  0    Allergies  Allergen Reactions  . Penicillins Nausea Only  . Sulfonamide Derivatives Hives    Happened when she was a child.    Past Medical History  Diagnosis Date  . Allergic rhinitis   . Fibrocystic breast   . GERD (gastroesophageal reflux disease)   . Migraines   . HLD (hyperlipidemia)   . Abdominal pain   . Nausea     Past Surgical History  Procedure Date  . Breast lumpectomy 12/00    Left breast  . Tubal ligation 1992  . Cholecystectomy 06/13/2011    Procedure: LAPAROSCOPIC CHOLECYSTECTOMY WITH INTRAOPERATIVE CHOLANGIOGRAM;  Surgeon: Ernestene Mention, MD;  Location: [facility];  Service: General;  Laterality: N/A;    Family History  Problem Relation Age of Onset  . Heart attack Father   . Hyperlipidemia Father   . Hearing loss Father   . Early menopause Mother   . Breast cancer      MGGM  . Ovarian cancer Paternal Aunt   . Cancer Paternal Aunt     ovarian  . Cancer Maternal Grandmother     Bladder, breast  . Crohn's disease Sister     History   Social History  . Marital Status: Married    Spouse Name: N/A    Number of Children: 2  . Years of Education: N/A   Occupational History  . Customer Service [employer]   Social History Main Topics   . Smoking status: Former Smoker    Quit date: 07/31/1996  . Smokeless tobacco: Never Used  . Alcohol Use: Yes     Comment: Occasional  . Drug Use: No  . Sexually Active: Not on file   Other Topics Concern  . 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

## 2012-09-14 ENCOUNTER — Other Ambulatory Visit: Payer: Self-pay

## 2012-12-04 ENCOUNTER — Ambulatory Visit (INDEPENDENT_AMBULATORY_CARE_PROVIDER_SITE_OTHER): Payer: BC Managed Care – PPO | Admitting: Family Medicine

## 2012-12-04 ENCOUNTER — Encounter: Payer: Self-pay | Admitting: Family Medicine

## 2012-12-04 VITALS — BP 100/68 | HR 76 | Temp 98.3°F | Ht 60.0 in | Wt 136.2 lb

## 2012-12-04 DIAGNOSIS — M7540 Impingement syndrome of unspecified shoulder: Secondary | ICD-10-CM

## 2012-12-04 DIAGNOSIS — M25819 Other specified joint disorders, unspecified shoulder: Secondary | ICD-10-CM

## 2012-12-04 NOTE — Progress Notes (Signed)
Nature conservation officer at [facility] [address] [place] [address] Phone: [phone] Fax: [phone]  Date:  12/04/2012   Name:  Samantha Howell   DOB:  1966-02-07   MRN:  [id number] Gender: female Age: 47 y.o.  Primary Physician:  Tillman Abide, MD  Evaluating MD: Hannah Beat, MD   Chief Complaint: Shoulder Pain and bicep pain   History of Present Illness:  Samantha Howell is a 47 y.o. pleasant patient who presents with the following:  Bilateral shoulders and bilateral biceps. [facility] [employer] and started about in [place]. And then felt like was sore, and when sleeing on sides, they will hurt. Better if taking advil. If tight. And  Some posterior capsule stretching. Abduction hurt some and reaching across the body.  Has had some deep tissue masssage. Curling ok. ERROM.  No occult injury.   No old trauma or injury.     Patient Active Problem List   Diagnosis Date Noted  . Fatigue 04/24/2012  . Sleep disturbance, unspecified 04/24/2012  . Routine general medical examination at a health care facility 05/23/2011  . PREMENSTRUAL DYSPHORIC SYNDROME 09/13/2009  . HYPERLIPIDEMIA 01/04/2009  . ANEMIA 12/16/2007  . HEARING LOSS 12/16/2007  . ALLERGIC RHINITIS 12/16/2007  . GERD 12/16/2007  . FIBROCYSTIC BREAST DISEASE 12/16/2007    Past Medical History  Diagnosis Date  . Allergic rhinitis   . Fibrocystic breast   . GERD (gastroesophageal reflux disease)   . Migraines   . HLD (hyperlipidemia)   . Abdominal pain   . Nausea     Past Surgical History  Procedure Laterality Date  . Breast lumpectomy  12/00    Left breast  . Tubal ligation  1992  . Cholecystectomy  06/13/2011    Procedure: LAPAROSCOPIC CHOLECYSTECTOMY WITH INTRAOPERATIVE CHOLANGIOGRAM;  Surgeon: Ernestene Mention, MD;  Location: [facility];  Service: General;  Laterality: N/A;    History   Social History  . Marital Status: Married    Spouse Name: N/A    Number of Children: 2  . Years  of Education: N/A   Occupational History  . Customer Service [employer]   Social History Main Topics  . Smoking status: Former Smoker    Quit date: 07/31/1996  . Smokeless tobacco: Never Used  . Alcohol Use: Yes     Comment: Occasional  . Drug Use: No  . Sexually Active: Not on file   Other Topics Concern  . 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 Heart attack Father   . Hyperlipidemia Father   . Hearing loss Father   . Early menopause Mother   . Breast cancer      MGGM  . Ovarian cancer Paternal Aunt   . Cancer Paternal Aunt     ovarian  . Cancer Maternal Grandmother     Bladder, breast  . Crohn's disease Sister     Allergies  Allergen Reactions  . Penicillins Nausea Only  . Sulfonamide Derivatives Hives    Happened when she was a child.    Medication list has been reviewed and updated.  Outpatient Prescriptions Prior to Visit  Medication Sig Dispense Refill  . levonorgestrel-ethinyl estradiol (NORDETTE) 0.15-30 MG-MCG tablet Take 1 tablet by mouth daily.  1 Package  11  .  temazepam (RESTORIL) 15 MG capsule Take 1-2 capsules (15-30 mg total) by mouth at bedtime as needed for sleep.  60 capsule  0   No facility-administered medications prior to visit.    Review of Systems:   GEN: No fevers, chills. Nontoxic. Primarily MSK c/o today. MSK: Detailed in the HPI GI: tolerating PO intake without difficulty Neuro: No numbness, parasthesias, or tingling associated. Otherwise the pertinent positives of the ROS are noted above.    Physical Examination: BP 100/68  Pulse 76  Temp(Src) 98.3 F (36.8 C) (Oral)  Ht 5' (1.524 m)  Wt 136 lb 4 oz (61.803 kg)  BMI 26.61 kg/m2  SpO2 98%  Ideal Body Weight: Weight in (lb) to have BMI = 25: 127.7   GEN: Well-developed,well-nourished,in no acute distress; alert,appropriate and cooperative throughout  examination HEENT: Normocephalic and atraumatic without obvious abnormalities. Ears, externally no deformities PULM: Breathing comfortably in no respiratory distress EXT: No clubbing, cyanosis, or edema PSYCH: Normally interactive. Cooperative during the interview. Pleasant. Friendly and conversant. Not anxious or depressed appearing. Normal, full affect.  Shoulder: B Inspection: No muscle wasting or winging Ecchymosis/edema: neg  AC joint, scapula, clavicle: NT Cervical spine: NT, full ROM Spurling's: neg Abduction: full, 5/5 - some pain Flexion: full, 5/5 IR, full, lift-off: 5/5 ER at neutral: full, 5/5 AC crossover: neg Neer: minimal Hawkins: pos Drop Test: neg Empty Can: pos Supraspinatus insertion: NT Bicipital groove: NT Speed's: neg Yergason's: neg Sulcus sign: neg Scapular dyskinesis: none C5-T1 intact  Neuro: Sensation intact Grip 5/5   Assessment and Plan:  Shoulder impingement syndrome, unspecified laterality  >25 minutes spent in face to face time with patient, >50% spent in counselling or coordination of care: mild impingement, likely initiated from overheads, [employer] presses, etc. Not normally done. Reviewed anatomy and rehab. Prn alleve or tylenol. Formal PT for RTC and scapular stabilization.   F/u 6 weeks.   Orders Today:  No orders of the defined types were placed in this encounter.    Updated Medication List: (Includes new medications, updates to list, dose adjustments) No orders of the defined types were placed in this encounter.    Medications Discontinued: Medications Discontinued During This Encounter  Medication Reason  . temazepam (RESTORIL) 15 MG capsule Error  . levonorgestrel-ethinyl estradiol (NORDETTE) 0.15-30 MG-MCG tablet Error      Signed, Laquan Beier T. Leontine Radman, MD 12/04/2012 9:48 AM

## 2013-01-23 ENCOUNTER — Ambulatory Visit (INDEPENDENT_AMBULATORY_CARE_PROVIDER_SITE_OTHER): Payer: BC Managed Care – PPO | Admitting: Internal Medicine

## 2013-01-23 ENCOUNTER — Other Ambulatory Visit (HOSPITAL_COMMUNITY)
Admission: RE | Admit: 2013-01-23 | Discharge: 2013-01-23 | Disposition: A | Discharge status: Home / Self Care | Payer: BC Managed Care – PPO | Source: Ambulatory Visit | Attending: Internal Medicine | Admitting: Internal Medicine

## 2013-01-23 ENCOUNTER — Encounter: Payer: Self-pay | Admitting: Internal Medicine

## 2013-01-23 VITALS — BP 110/70 | HR 80 | Temp 98.0°F | Ht 60.0 in | Wt 139.0 lb

## 2013-01-23 DIAGNOSIS — Z0000 Encounter for general adult medical examination without abnormal findings: Secondary | ICD-10-CM

## 2013-01-23 DIAGNOSIS — J309 Allergic rhinitis, unspecified: Secondary | ICD-10-CM

## 2013-01-23 DIAGNOSIS — Z01419 Encounter for gynecological examination (general) (routine) without abnormal findings: Secondary | ICD-10-CM | POA: Insufficient documentation

## 2013-01-23 DIAGNOSIS — Z23 Encounter for immunization: Secondary | ICD-10-CM

## 2013-01-23 DIAGNOSIS — Z1151 Encounter for screening for human papillomavirus (HPV): Secondary | ICD-10-CM | POA: Insufficient documentation

## 2013-01-23 NOTE — Assessment & Plan Note (Signed)
Healthy Discussed getting more exercise---maybe when she is working from home mammo due 11/14 Tdap today Feels better off the OCP----cycles rare but doesn't appear to be through the change yet

## 2013-01-23 NOTE — Progress Notes (Signed)
Subjective:    Patient ID: Samantha Howell, female    DOB: 1966-05-29, 47 y.o.   MRN: [id number]  HPI Here for physical Saw Dr Patsy Lager about shoulders--better with the exercise program  No longer fatigued Did stop the OCP Warmer than in pst but no night sweats Only 1 period this year thus far Mood is better  Trying to watch what she eats Lots of work hours--discussed strategies for getting exercise in  No current outpatient prescriptions on file prior to visit.   No current facility-administered medications on file prior to visit.    Allergies  Allergen Reactions  . Penicillins Nausea Only  . Sulfonamide Derivatives Hives    Happened when she was a child.    Past Medical History  Diagnosis Date  . Allergic rhinitis   . Fibrocystic breast   . GERD (gastroesophageal reflux disease)   . Migraines   . HLD (hyperlipidemia)   . Abdominal pain   . Nausea     Past Surgical History  Procedure Laterality Date  . Breast lumpectomy  12/00    Left breast  . Tubal ligation  1992  . Cholecystectomy  06/13/2011    Procedure: LAPAROSCOPIC CHOLECYSTECTOMY WITH INTRAOPERATIVE CHOLANGIOGRAM;  Surgeon: Ernestene Mention, MD;  Location: [facility];  Service: General;  Laterality: N/A;    Family History  Problem Relation Age of Onset  . Heart attack Father   . Hyperlipidemia Father   . Hearing loss Father   . Early menopause Mother   . Breast cancer      MGGM  . Ovarian cancer Paternal Aunt   . Cancer Paternal Aunt     ovarian  . Cancer Maternal Grandmother     Bladder, breast  . Crohn's disease Sister     History   Social History  . Marital Status: Married    Spouse Name: N/A    Number of Children: 2  . Years of Education: N/A   Occupational History  . Customer Service [employer]   Social History Main Topics  . Smoking status: Former Smoker    Quit date: 07/31/1996  . Smokeless tobacco: Never Used  . Alcohol Use: Yes     Comment: Occasional  . Drug Use: No  .  Sexually Active: Not on file   Other Topics Concern  . 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       Wears seat belt  HENT: Positive for congestion and rhinorrhea. Negative for hearing loss, dental problem and tinnitus.        Regular with dentist  Eyes: Negative for visual disturbance.       No diplopia or unilateral vision loss  Respiratory: Negative for cough, chest tightness and shortness of breath.   Cardiovascular: Negative for chest pain, palpitations and leg swelling.  Gastrointestinal: Negative for nausea, vomiting, abdominal pain, constipation and blood in stool.       No heartburn  Endocrine: Positive for heat intolerance. Negative for cold intolerance.  Genitourinary: Negative for dysuria, hematuria, difficulty urinating and dyspareunia.  Musculoskeletal: Positive for arthralgias. Negative for back pain and joint swelling.       Shoulder problems only  Skin: Negative for rash.       Has a few annoying knots on legs  Allergic/Immunologic: Positive  for environmental allergies. Negative for immunocompromised state.       Cetirizine and claritin help  Neurological: Negative for dizziness, syncope, weakness, light-headedness and headaches.  Hematological: Negative for adenopathy. Does not bruise/bleed easily.  Psychiatric/Behavioral: Negative for sleep disturbance and dysphoric mood. The patient is not nervous/anxious.       Objective:   Physical Exam  Constitutional: She is oriented to person, place, and time. She appears well-developed and well-nourished. No distress.  HENT:  Head: Normocephalic and atraumatic.  Right Ear: External ear normal.  Left Ear: External ear normal.  Mouth/Throat: Oropharynx is clear and moist. No oropharyngeal exudate.  Eyes: Conjunctivae and EOM are normal. Pupils are equal, round, and  reactive to light.  Neck: Normal range of motion. Neck supple. No thyromegaly present.  Cardiovascular: Normal rate, regular rhythm, normal heart sounds and intact distal pulses.  Exam reveals no gallop.   No murmur heard. Pulmonary/Chest: Effort normal and breath sounds normal. No respiratory distress. She has no wheezes. She has no rales.  Abdominal: Soft. Bowel sounds are normal. There is no tenderness.  Genitourinary:  Moderate cystic changes in both breasts  Normal introitus and cervix. Not menopausal Pap done Bimanual negative  Musculoskeletal: She exhibits no edema and no tenderness.  Lymphadenopathy:    She has no cervical adenopathy.  Neurological: She is alert and oriented to person, place, and time.  Skin: Skin is warm. No rash noted. No erythema.  Psychiatric: She has a normal mood and affect. Her behavior is normal.          Assessment & Plan:

## 2013-01-23 NOTE — Assessment & Plan Note (Signed)
Does fine with the OTC meds 

## 2013-01-23 NOTE — Addendum Note (Signed)
Addended by: Sueanne Margarita on: 01/23/2013 04:15 PM   Modules accepted: Orders

## 2013-04-14 ENCOUNTER — Ambulatory Visit (INDEPENDENT_AMBULATORY_CARE_PROVIDER_SITE_OTHER): Payer: BC Managed Care – PPO | Admitting: Family Medicine

## 2013-04-14 ENCOUNTER — Encounter: Payer: Self-pay | Admitting: Family Medicine

## 2013-04-14 VITALS — BP 100/70 | HR 68 | Temp 98.3°F | Ht 60.0 in | Wt 135.5 lb

## 2013-04-14 DIAGNOSIS — M25559 Pain in unspecified hip: Secondary | ICD-10-CM

## 2013-04-14 DIAGNOSIS — D1779 Benign lipomatous neoplasm of other sites: Secondary | ICD-10-CM

## 2013-04-14 DIAGNOSIS — M25529 Pain in unspecified elbow: Secondary | ICD-10-CM

## 2013-04-14 DIAGNOSIS — D1720 Benign lipomatous neoplasm of skin and subcutaneous tissue of unspecified limb: Secondary | ICD-10-CM

## 2013-04-14 DIAGNOSIS — M7540 Impingement syndrome of unspecified shoulder: Secondary | ICD-10-CM

## 2013-04-14 DIAGNOSIS — M722 Plantar fascial fibromatosis: Secondary | ICD-10-CM

## 2013-04-14 DIAGNOSIS — M2550 Pain in unspecified joint: Secondary | ICD-10-CM

## 2013-04-14 NOTE — Progress Notes (Signed)
Nature conservation officer at [facility] [address] [place] [address] Phone: [phone] Fax: [phone]  Date:  04/14/2013   Name:  Samantha Howell   DOB:  Dec 07, 1965   MRN:  [id number] Gender: female Age: 47 y.o.  Primary Physician:  Tillman Abide, MD  Evaluating MD: Hannah Beat, MD   Chief Complaint: Follow-up and knot on arm &foot   History of Present Illness:  Samantha Howell is a 47 y.o. pleasant patient who presents with the following:  Presents with f/u and multiple MSK complaints. Continued mild pain with abduction and reaching around body. Bilateral. No exercise in 1 year.   Place on antecubital fossa, thing on foot. Cannot sleep on shoulders.   Also intermittent "hip pain" laterally and posteriorly, some back pain without radiculopathy, B elbow pain. Denies much hand pain. No trauma.  No RA or psoriatic arthritis in her family.  Nothing in the gym.    Patient Active Problem List   Diagnosis Date Noted  . Sleep disturbance, unspecified 04/24/2012  . Routine general medical examination at a health care facility 05/23/2011  . HYPERLIPIDEMIA 01/04/2009  . HEARING LOSS 12/16/2007  . ALLERGIC RHINITIS 12/16/2007  . GERD 12/16/2007  . FIBROCYSTIC BREAST DISEASE 12/16/2007    Past Medical History  Diagnosis Date  . Allergic rhinitis   . Fibrocystic breast   . GERD (gastroesophageal reflux disease)   . Migraines   . HLD (hyperlipidemia)   . Abdominal pain   . Nausea     Past Surgical History  Procedure Laterality Date  . Breast lumpectomy  12/00    Left breast  . Tubal ligation  1992  . Cholecystectomy  06/13/2011    Procedure: LAPAROSCOPIC CHOLECYSTECTOMY WITH INTRAOPERATIVE CHOLANGIOGRAM;  Surgeon: Ernestene Mention, MD;  Location: [facility];  Service: General;  Laterality: N/A;    History   Social History  . Marital Status: Married    Spouse Name: N/A    Number of Children: 2  . Years of Education: N/A   Occupational History  .  Customer Service [employer]   Social History Main Topics  . Smoking status: Former Smoker    Quit date: 07/31/1996  . Smokeless tobacco: Never Used  . Alcohol Use: Yes     Comment: Occasional  . Drug Use: No  . Sexual Activity: Not on file   Other Topics Concern  . 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 Heart attack Father   . Hyperlipidemia Father   . Hearing loss Father   . Early menopause Mother   . Breast cancer      MGGM  . Ovarian cancer Paternal Aunt   . Cancer Paternal Aunt     ovarian  . Cancer Maternal Grandmother     Bladder, breast  . Crohn's disease Sister     Allergies  Allergen Reactions  . Penicillins Nausea Only  . Sulfonamide Derivatives Hives    Happened when she was a child.    Medication list has been reviewed and updated.  No outpatient prescriptions prior to visit.   No facility-administered medications prior to visit.    Review of Systems:   GEN: No fevers, chills. Nontoxic. Primarily MSK c/o today. MSK: Detailed in the HPI GI: tolerating PO intake without difficulty Neuro:  No numbness, parasthesias, or tingling associated. Otherwise the pertinent positives of the ROS are noted above.    Physical Examination: BP 100/70  Pulse 68  Temp(Src) 98.3 F (36.8 C) (Oral)  Ht 5' (1.524 m)  Wt 135 lb 8 oz (61.462 kg)  BMI 26.46 kg/m2  LMP 02/28/2013  Ideal Body Weight: Weight in (lb) to have BMI = 25: 127.7   GEN: WDWN, NAD, Non-toxic, Alert & Oriented x 3 HEENT: Atraumatic, Normocephalic.  Ears and Nose: No external deformity. EXTR: No clubbing/cyanosis/edema NEURO: Normal gait.  PSYCH: Normally interactive. Conversant. Not depressed or anxious appearing.  Calm demeanor.   Shoulder: B Inspection: No muscle wasting or winging Ecchymosis/edema: neg  AC joint, scapula, clavicle: NT Cervical spine: NT, full  ROM Spurling's: neg Abduction: full, 5/5 Flexion: full, 5/5 IR, full, lift-off: 5/5 ER at neutral: full, 5/5 AC crossover and compression: neg Neer: neg Hawkins: minimally pos Drop Test: neg Empty Can: neg Supraspinatus insertion: NT Bicipital groove: NT Speed's: neg Yergason's: neg Sulcus sign: neg Scapular dyskinesis: none C5-T1 intact Sensation intact Grip 5/5   HIP EXAM: SIDE: B ROM: Abduction, Flexion, Internal and External range of motion: excellent Pain with terminal IROM and EROM: no GTB: NT SLR: NEG Knees: No effusion FABER: NT REVERSE FABER: NT, neg Piriformis: NT at direct palpation Str: flexion: 5/5 abduction: 5/5 adduction: 5/5 Strength testing non-tender   full ROM at elbow, str 5/5  No synovitis on hands or wrists  Assessment and Plan:  Polyarthralgia - Plan: ANA, Rheumatoid factor, Cyclic citrul peptide antibody, IgG, Sedimentation rate  Shoulder impingement syndrome, unspecified laterality  Hip pain, acute, unspecified laterality  Elbow joint pain, unspecified laterality  Lipoma of arm  Plantar fascial fibromatosis  >25 minutes spent in face to face time with patient, >50% spent in counselling or coordination of care: I suspect this is multiple small things bothering the patient as she has gotten more out of shape. Joints move very well with good ROM. Even shoulders are relatively benign on exam. Basic rhem eval. I strongly encouraged her to start exercising again.  Reassured her about plantar fibromatosis x 1 which is totally benign and nontender.  L antecubital fossa with what appears to me to be a lipoma, reassured.  Orders Today:  Orders Placed This Encounter  Procedures  . ANA  . Rheumatoid factor  . Cyclic citrul peptide antibody, IgG  . Sedimentation rate    Updated Medication List: (Includes new medications, updates to list, dose adjustments) No orders of the defined types were placed in this encounter.    Medications  Discontinued: There are no discontinued medications.    Signed, Elpidio Galea. Eathan Groman, MD 04/14/2013 11:34 AM

## 2013-04-15 DIAGNOSIS — D1720 Benign lipomatous neoplasm of skin and subcutaneous tissue of unspecified limb: Secondary | ICD-10-CM | POA: Insufficient documentation

## 2013-04-15 LAB — CYCLIC CITRUL PEPTIDE ANTIBODY, IGG: Cyclic Citrullin Peptide Ab: <2 U/mL (ref 0.0–5.0)

## 2013-05-02 ENCOUNTER — Encounter: Payer: Self-pay | Admitting: Internal Medicine

## 2013-06-19 ENCOUNTER — Encounter: Payer: BC Managed Care – PPO | Admitting: Internal Medicine

## 2013-08-05 ENCOUNTER — Other Ambulatory Visit: Payer: Self-pay

## 2013-08-05 DIAGNOSIS — Z1231 Encounter for screening mammogram for malignant neoplasm of breast: Secondary | ICD-10-CM

## 2013-08-22 ENCOUNTER — Ambulatory Visit
Admission: RE | Admit: 2013-08-22 | Discharge: 2013-08-22 | Disposition: A | Discharge status: Home / Self Care | Payer: BC Managed Care – PPO | Source: Ambulatory Visit

## 2013-08-22 DIAGNOSIS — Z1231 Encounter for screening mammogram for malignant neoplasm of breast: Secondary | ICD-10-CM

## 2013-09-03 ENCOUNTER — Encounter: Payer: Self-pay | Admitting: Internal Medicine

## 2014-08-03 ENCOUNTER — Other Ambulatory Visit: Payer: Self-pay

## 2014-08-03 DIAGNOSIS — Z1231 Encounter for screening mammogram for malignant neoplasm of breast: Secondary | ICD-10-CM

## 2014-08-24 ENCOUNTER — Ambulatory Visit: Payer: Self-pay

## 2014-08-26 ENCOUNTER — Encounter: Payer: Self-pay | Admitting: Internal Medicine

## 2014-08-26 ENCOUNTER — Ambulatory Visit (INDEPENDENT_AMBULATORY_CARE_PROVIDER_SITE_OTHER): Payer: BLUE CROSS/BLUE SHIELD | Admitting: Internal Medicine

## 2014-08-26 VITALS — BP 112/80 | HR 76 | Temp 98.4°F | Ht 59.5 in | Wt 135.0 lb

## 2014-08-26 DIAGNOSIS — G479 Sleep disorder, unspecified: Secondary | ICD-10-CM

## 2014-08-26 DIAGNOSIS — E785 Hyperlipidemia, unspecified: Secondary | ICD-10-CM

## 2014-08-26 DIAGNOSIS — Z0000 Encounter for general adult medical examination without abnormal findings: Secondary | ICD-10-CM

## 2014-08-26 MED ORDER — CYCLOBENZAPRINE HCL 5 MG PO TABS: 5.0000 mg | ORAL_TABLET | Freq: Every evening | ORAL | Status: DC | PRN

## 2014-08-26 NOTE — Assessment & Plan Note (Signed)
Almost seems like a fibromyalgia like syndrome Has done well with cyclobenzaprine so Rx given for occasional use

## 2014-08-26 NOTE — Assessment & Plan Note (Signed)
Healthy Discussed increasing exercise Now menopausal Getting mammogram tomorrow---recommended every other year now. Declined breast exam after discussion Colon screening starts next year Pap due 2017

## 2014-08-26 NOTE — Assessment & Plan Note (Signed)
No statin but will recheck

## 2014-08-26 NOTE — Progress Notes (Signed)
Subjective:    Patient ID: Samantha Howell, female    DOB: [birth year], 49 y.o.   MRN: [id number]  HPI Here for physical  Shoulder and other pain is better Takes ibuprofen 400-600mg  at bedtime Occasional advil cold and sinus in AM if congested  No periods since 8/14!! Hot and cold at night--restless at night Some daytime somnolence--- didn't like meds for this Will try low dose cyclobenzaprine at times and this really helps  Feels some "twitching" in right ear Hearing seems okay--not as good as in past No tinnitus No vertigo--just mild orthostatic dizziness at times Had shingles in right V2  No current outpatient prescriptions on file prior to visit.   No current facility-administered medications on file prior to visit.    Allergies  Allergen Reactions  . Penicillins Nausea Only  . Sulfonamide Derivatives Hives    Happened when she was a child.    Past Medical History  Diagnosis Date  . Allergic rhinitis   . Fibrocystic breast   . GERD (gastroesophageal reflux disease)   . Migraines   . HLD (hyperlipidemia)   . Abdominal pain   . Nausea     Past Surgical History  Procedure Laterality Date  . Breast lumpectomy  12/00    Left breast  . Tubal ligation  1992  . Cholecystectomy  06/13/2011    Procedure: LAPAROSCOPIC CHOLECYSTECTOMY WITH INTRAOPERATIVE CHOLANGIOGRAM;  Surgeon: Adin Hector, MD;  Location: [facility];  Service: General;  Laterality: N/A;    Family History  Problem Relation Age of Onset  . Heart attack Father   . Hyperlipidemia Father   . Hearing loss Father   . Early menopause Mother   . Breast cancer      MGGM  . Ovarian cancer Paternal Aunt   . Cancer Paternal Aunt     ovarian  . Cancer Maternal Grandmother     Bladder, breast  . Crohn's disease Sister     History   Social History  . Marital Status: Married    Spouse Name: N/A    Number of Children: 2  . Years of Education: N/A   Occupational History  . [employer]   Social History Main Topics  . Smoking status: Former Smoker    Quit date: 07/31/1996  . Smokeless tobacco: Never Used  . Alcohol Use: 0.0 oz/week    0 Not specified per week     Comment: Occasional  . Drug Use: No  . Sexual Activity: Not on file   Other Topics Concern  . 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        Regular with dentist  Eyes: Negative for visual disturbance.       No diplopia or unilateral vision loss  Gastrointestinal: Negative for nausea, vomiting, abdominal pain, constipation and blood in stool.       No heartburn  Endocrine: Negative for polydipsia and polyuria.  Genitourinary: Negative for dysuria, hematuria, difficulty urinating and dyspareunia.  Musculoskeletal: Negative for back pain, joint swelling and arthralgias.       Gets stiff at night at times--mostly hands  Skin: Negative for rash.  No suspicious lesions  Allergic/Immunologic: Positive for environmental allergies. Negative for immunocompromised state.  Neurological: Positive for light-headedness, numbness and headaches. Negative for dizziness, syncope and weakness.       Rare night migraines--?hormonal Median nerve numbness in right hand occasionally  Psychiatric/Behavioral: Positive for sleep disturbance. Negative for dysphoric mood. The patient is nervous/anxious.        Some mild anxiety at times---able to walk, take deep breaths, etc and it passes       Objective:   Physical Exam  Constitutional: She is oriented to person, place, and time. She appears well-developed and well-nourished. No distress.  HENT:  Head: Normocephalic and atraumatic.  Right Ear: External ear normal.  Left Ear: External ear normal.    Mouth/Throat: Oropharynx is clear and moist. No oropharyngeal exudate.  Eyes: Conjunctivae and EOM are normal. Pupils are equal, round, and reactive to light.  Neck: Normal range of motion. Neck supple. No thyromegaly present.  Cardiovascular: Normal rate, regular rhythm, normal heart sounds and intact distal pulses.  Exam reveals no gallop.   No murmur heard. Pulmonary/Chest: Effort normal and breath sounds normal. No respiratory distress. She has no wheezes. She has no rales.  Abdominal: Soft. There is no tenderness.  Musculoskeletal: She exhibits no edema or tenderness.  Lymphadenopathy:    She has no cervical adenopathy.  Neurological: She is alert and oriented to person, place, and time.  Skin: No rash noted. No erythema.  Psychiatric: She has a normal mood and affect. Her behavior is normal.          Assessment & Plan:

## 2014-08-27 ENCOUNTER — Ambulatory Visit
Admission: RE | Admit: 2014-08-27 | Discharge: 2014-08-27 | Disposition: A | Discharge status: Home / Self Care | Payer: BLUE CROSS/BLUE SHIELD | Source: Ambulatory Visit

## 2014-08-27 DIAGNOSIS — Z1231 Encounter for screening mammogram for malignant neoplasm of breast: Secondary | ICD-10-CM

## 2014-08-27 LAB — CBC WITH DIFFERENTIAL/PLATELET
BASOS: 1 %
Basophils Absolute: 0 10*3/uL (ref 0.0–0.2)
Eos: 3 %
Eosinophils Absolute: 0.2 10*3/uL (ref 0.0–0.4)
HCT: 40.8 % (ref 34.0–46.6)
Hemoglobin: 14 g/dL (ref 11.1–15.9)
IMMATURE GRANS (ABS): 0 10*3/uL (ref 0.0–0.1)
Immature Granulocytes: 0 %
Lymphocytes Absolute: 2 10*3/uL (ref 0.7–3.1)
Lymphs: 43 %
MCH: 28.9 pg (ref 26.6–33.0)
MCHC: 34.3 g/dL (ref 31.5–35.7)
MCV: 84 fL (ref 79–97)
MONOCYTES: 10 %
MONOS ABS: 0.4 10*3/uL (ref 0.1–0.9)
Neutrophils Absolute: 2 10*3/uL (ref 1.4–7.0)
Neutrophils Relative %: 43 %
PLATELETS: 216 10*3/uL (ref 150–379)
RBC: 4.85 x10E6/uL (ref 3.77–5.28)
RDW: 13.7 % (ref 12.3–15.4)
WBC: 4.5 10*3/uL (ref 3.4–10.8)

## 2014-08-27 LAB — LIPID PANEL
Chol/HDL Ratio: 4.8 ratio units — ABNORMAL HIGH (ref 0.0–4.4)
Cholesterol, Total: 252 mg/dL — ABNORMAL HIGH (ref 100–199)
HDL: 52 mg/dL (ref >39)
LDL CALC: 162 mg/dL — AB (ref 0–99)
TRIGLYCERIDES: 191 mg/dL — AB (ref 0–149)
VLDL CHOLESTEROL CAL: 38 mg/dL (ref 5–40)

## 2014-08-27 LAB — COMPREHENSIVE METABOLIC PANEL
A/G RATIO: 2.3 (ref 1.1–2.5)
ALBUMIN: 4.5 g/dL (ref 3.5–5.5)
ALT: 19 IU/L (ref 0–32)
AST: 17 IU/L (ref 0–40)
Alkaline Phosphatase: 82 IU/L (ref 39–117)
BUN / CREAT RATIO: 20 (ref 9–23)
BUN: 14 mg/dL (ref 6–24)
CO2: 26 mmol/L (ref 18–29)
CREATININE: 0.7 mg/dL (ref 0.57–1.00)
Calcium: 9.8 mg/dL (ref 8.7–10.2)
Chloride: 102 mmol/L (ref 97–108)
GFR calc Af Amer: 118 mL/min/{1.73_m2} (ref >59)
GFR, EST NON AFRICAN AMERICAN: 102 mL/min/{1.73_m2} (ref >59)
Globulin, Total: 2 g/dL (ref 1.5–4.5)
Glucose: 80 mg/dL (ref 65–99)
Potassium: 4.2 mmol/L (ref 3.5–5.2)
Sodium: 144 mmol/L (ref 134–144)
Total Bilirubin: <0.2 mg/dL (ref 0.0–1.2)
Total Protein: 6.5 g/dL (ref 6.0–8.5)

## 2014-08-27 LAB — T4, FREE: Free T4: 1.04 ng/dL (ref 0.82–1.77)

## 2014-09-14 ENCOUNTER — Ambulatory Visit (INDEPENDENT_AMBULATORY_CARE_PROVIDER_SITE_OTHER): Payer: BLUE CROSS/BLUE SHIELD | Admitting: Internal Medicine

## 2014-09-14 ENCOUNTER — Telehealth: Payer: Self-pay | Admitting: Internal Medicine

## 2014-09-14 ENCOUNTER — Encounter: Payer: Self-pay | Admitting: Internal Medicine

## 2014-09-14 VITALS — BP 122/84 | HR 78 | Temp 98.1°F | Wt 135.0 lb

## 2014-09-14 DIAGNOSIS — R309 Painful micturition, unspecified: Secondary | ICD-10-CM

## 2014-09-14 DIAGNOSIS — N390 Urinary tract infection, site not specified: Secondary | ICD-10-CM | POA: Insufficient documentation

## 2014-09-14 LAB — POCT URINALYSIS DIPSTICK
BILIRUBIN UA: NEGATIVE
Glucose, UA: NEGATIVE
KETONES UA: NEGATIVE
Nitrite, UA: NEGATIVE
PH UA: 6
Spec Grav, UA: 1.01
UROBILINOGEN UA: 0.2

## 2014-09-14 MED ORDER — CIPROFLOXACIN HCL 500 MG PO TABS: 500.0000 mg | ORAL_TABLET | Freq: Two times a day (BID) | ORAL | Status: DC

## 2014-09-14 NOTE — Patient Instructions (Signed)
Start the cipro today and take 2 doses. If your symptoms are gone by tomorrow, you may need only 3 days of treatment. If the symptoms persist, take the full course. There are refills so you can take 1 antibiotic about 1 hour after sex to prevent recurrent infections.

## 2014-09-14 NOTE — Assessment & Plan Note (Signed)
Has cystitis symptoms but sig pyuria and worsening symptoms May need full 10 days but will see how she responds Discussed post coital prophylaxis---so will give refills

## 2014-09-14 NOTE — Progress Notes (Signed)
Pre visit review using our clinic review tool, if applicable. No additional management support is needed unless otherwise documented below in the visit note. 

## 2014-09-14 NOTE — Telephone Encounter (Signed)
Will evaluate at appt 

## 2014-09-14 NOTE — Telephone Encounter (Signed)
[facility] Patient Name: Samantha Howell DOB: 1966/06/01 Initial Comment Caller states needs to get meds for bladder infection, otc isn't working Nurse Assessment Nurse: Ronnald Ramp, RN, Miranda Date/Time (Eastern Time): 09/14/2014 10:05:08 AM Confirm and document reason for call. If symptomatic, describe symptoms. ---Caller states she is having pain/pressure in lower abdomen, frequent urination, and burning when urinating yesterday morning. Has the patient traveled out of the country within the last 30 days? ---Not Applicable Does the patient require triage? ---Yes Related visit to physician within the last 2 weeks? ---No Does the PT have any chronic conditions? (i.e. diabetes, asthma, etc.) ---No Did the patient indicate they were pregnant? ---No Guidelines Guideline Title Affirmed Question Affirmed Notes Urination Pain - Female All other patients with painful urination (Exception: [1] EITHER frequency or urgency AND [2] has on-call doctor) Final Disposition User See Physician within 24 Hours Ronnald Ramp, RN, Miranda Comments Appt scheduled with Dr. Silvio Pate at 11:15am

## 2014-09-14 NOTE — Progress Notes (Signed)
   Subjective:    Patient ID: Samantha Howell, female    DOB: 1966-06-02, 49 y.o.   MRN: [id number]  HPI Here due to urinary problems Last infection in 2014  Started with symptoms yesterday--- awoken at 3AM Pressure, burning dysuria  Urinary urgency and increased frequency  Has been drinking cranberry juice and lots of water Waited 3 hours at [facility]--- never seen Tried azo pills  No fever No back pain Some lower abdominal pain  Does tend to have post coital symptoms  Current Outpatient Prescriptions on File Prior to Visit  Medication Sig Dispense Refill  . cetirizine (ZYRTEC) 10 MG tablet Take 10 mg by mouth daily.    . cyclobenzaprine (FLEXERIL) 5 MG tablet Take 1 tablet (5 mg total) by mouth at bedtime as needed for muscle spasms. 30 tablet 1  . omeprazole (PRILOSEC OTC) 20 MG tablet Take 20 mg by mouth daily.     No current facility-administered medications on file prior to visit.    Allergies  Allergen Reactions  . Penicillins Nausea Only  . Sulfonamide Derivatives Hives    Happened when she was a child.    Past Medical History  Diagnosis Date  . Allergic rhinitis   . Fibrocystic breast   . GERD (gastroesophageal reflux disease)   . Migraines   . HLD (hyperlipidemia)   . Abdominal pain   . Nausea     Past Surgical History  Procedure Laterality Date  . Breast lumpectomy  12/00    Left breast  . Tubal ligation  1992  . Cholecystectomy  06/13/2011    Procedure: LAPAROSCOPIC CHOLECYSTECTOMY WITH INTRAOPERATIVE CHOLANGIOGRAM;  Surgeon: Adin Hector, MD;  Location: [facility];  Service: General;  Laterality: N/A;    Family History  Problem Relation Age of Onset  . Heart attack Father   . Hyperlipidemia Father   . Hearing loss Father   . Early menopause Mother   . Breast cancer      MGGM  . Ovarian cancer Paternal Aunt   . Cancer Paternal Aunt     ovarian  . Cancer Maternal Grandmother     Bladder, breast  . Crohn's disease Sister      History   Social History  . Marital Status: Married    Spouse Name: N/A  . Number of Children: 2  . Years of Education: N/A   Occupational History  . [employer]   Social History Main Topics  . Smoking status: Former Smoker    Quit date: 07/31/1996  . Smokeless tobacco: Never Used  . Alcohol Use: 0.0 oz/week    0 Standard drinks or equivalent per week     Comment: Occasional  . Drug Use: No  . Sexual Activity: Not on file   Other Topics Concern  . 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 No distress.  Abdominal:  Mild suprapubic tenderness  Musculoskeletal:  No CVA tenderness          Assessment & Plan:

## 2015-02-15 ENCOUNTER — Encounter: Payer: Self-pay | Admitting: Internal Medicine

## 2015-05-31 ENCOUNTER — Ambulatory Visit (INDEPENDENT_AMBULATORY_CARE_PROVIDER_SITE_OTHER): Payer: BLUE CROSS/BLUE SHIELD | Admitting: Family Medicine

## 2015-05-31 ENCOUNTER — Encounter: Payer: Self-pay | Admitting: Family Medicine

## 2015-05-31 ENCOUNTER — Ambulatory Visit (INDEPENDENT_AMBULATORY_CARE_PROVIDER_SITE_OTHER)
Admission: RE | Admit: 2015-05-31 | Discharge: 2015-05-31 | Disposition: A | Discharge status: Home / Self Care | Payer: BLUE CROSS/BLUE SHIELD | Source: Ambulatory Visit | Attending: Family Medicine | Admitting: Family Medicine

## 2015-05-31 VITALS — BP 127/80 | HR 70 | Temp 97.8°F | Ht 59.5 in | Wt 137.0 lb

## 2015-05-31 DIAGNOSIS — M5440 Lumbago with sciatica, unspecified side: Secondary | ICD-10-CM | POA: Diagnosis not present

## 2015-05-31 NOTE — Progress Notes (Signed)
Dr. Frederico Hamman T. Macklyn Glandon, MD, [facility] [place], [address] Phone: [phone] Fax: [phone]  05/31/2015  Patient: Samantha Howell, MRN: [id number], DOB: 1965-12-19, 49 y.o.  Primary Physician:  Viviana Simpler, MD   Chief Complaint  Patient presents with  . Back Pain    "Feels like vertebrae moves"   Subjective:   HEIRESS Howell is a 49 y.o. very pleasant female patient who presents with the following: Back Pain  ongoing for approximately: 2 months The patient has had back pain before. The back pain is localized into the lumbar spine area. They also describe no radiculopathy.  Feels like vertebra movin Started exercising in the last months  Pain in her legs.  Will get some B toe pains Aback will feel tired and weak.   Last couple of years, not in best shape.  Working with Clinical research associate, [employer].  Workinng out at [employer] with [employer].  Last week, felt like needed and pulled posterior and popped her back.   No numbness or tingling. No bowel or bladder incontinence. No focal weakness. Prior interventions: Trainer directed PT Physical therapy: No Chiropractic manipulations: No Acupuncture: No Osteopathic manipulation: No Heat or cold: Minimal effect  Past Medical History, Surgical History, Family History, Medications, Allergies have been reviewed and updated if relevant.  Patient Active Problem List   Diagnosis Date Noted  . UTI (urinary tract infection) 09/14/2014  . Lipoma of arm 04/15/2013  . Sleep disturbance 04/24/2012  . Routine general medical examination at a health care facility 05/23/2011  . Hyperlipemia 01/04/2009  . HEARING LOSS 12/16/2007  . ALLERGIC RHINITIS 12/16/2007  . GERD 12/16/2007  . FIBROCYSTIC BREAST DISEASE 12/16/2007    Past Medical History  Diagnosis Date  . Allergic rhinitis   . Fibrocystic breast   . GERD (gastroesophageal reflux disease)   . Migraines   .  HLD (hyperlipidemia)   . Abdominal pain   . Nausea     Past Surgical History  Procedure Laterality Date  . Breast lumpectomy  12/00    Left breast  . Tubal ligation  1992  . Cholecystectomy  06/13/2011    Procedure: LAPAROSCOPIC CHOLECYSTECTOMY WITH INTRAOPERATIVE CHOLANGIOGRAM;  Surgeon: Adin Hector, MD;  Location: [facility];  Service: General;  Laterality: N/A;    Social History   Social History  . Marital Status: Married    Spouse Name: N/A  . Number of Children: 2  . Years of Education: N/A   Occupational History  . [place] [employer]   Social History Main Topics  . Smoking status: Former Smoker    Quit date: 07/31/1996  . Smokeless tobacco: Never Used  . Alcohol Use: 0.0 oz/week    0 Standard drinks or equivalent per week     Comment: Occasional  . Drug Use: No  . Sexual Activity: Not on file   Other Topics Concern  . 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 Heart attack Father   . Hyperlipidemia Father   . Hearing loss Father   . Early menopause Mother   . Breast cancer      MGGM  . Ovarian cancer Paternal Aunt   . Cancer Paternal Aunt     ovarian  . Cancer  Maternal Grandmother     Bladder, breast  . Crohn's disease Sister     Allergies  Allergen Reactions  . Penicillins Nausea Only  . Sulfonamide Derivatives Hives    Happened when she was a child.    Medication list reviewed and updated in full in [facility].  GEN: No fevers, chills. Nontoxic. Primarily MSK c/o today. MSK: Detailed in the HPI GI: tolerating PO intake without difficulty Neuro: As above  Otherwise the pertinent positives of the ROS are noted above.    Objective:   Blood pressure 127/80, pulse 70, temperature 97.8 F (36.6 C), temperature source Oral, height 4' 11.5" (1.511 m), weight 137 lb (62.143 kg), last menstrual period  03/02/2013.  Gen: Well-developed,well-nourished,in no acute distress; alert,appropriate and cooperative throughout examination HEENT: Normocephalic and atraumatic without obvious abnormalities.  Ears, externally no deformities Pulm: Breathing comfortably in no respiratory distress Range of motion at  the waist: Flexion, rotation and lateral bending: minimal restriction  No echymosis or edema Rises to examination table with no difficulty Gait: minimally antalgic  Inspection/Deformity: No abnormality Paraspinus T:  Mild ttp diffusely  B Ankle Dorsiflexion (L5,4): 5/5 B Great Toe Dorsiflexion (L5,4): 5/5 Heel Walk (L5): WNL Toe Walk (S1): WNL Rise/Squat (L4): WNL, mild pain  SENSORY B Medial Foot (L4): WNL B Dorsum (L5): WNL B Lateral (S1): WNL Light Touch: WNL Pinprick: WNL  REFLEXES Knee (L4): 2+ Ankle (S1): 2+  B SLR, seated: neg B SLR, supine: neg B FABER: neg B Reverse FABER: neg B Greater Troch: NT B Log Roll: neg B Stork: NT B Sciatic Notch: NT  Radiology: Dg Lumbar Spine Complete  05/31/2015  CLINICAL DATA:  Mid to low back pain for the past year without history of trauma EXAM: LUMBAR SPINE - COMPLETE 4+ VIEW COMPARISON:  Abdominal and pelvic CT scan dated May 25, 2011 FINDINGS: The lumbar vertebral bodies are preserved in height. The disc space heights are reasonably well maintained though there is minimal narrowing at L4-5. There is no spondylolisthesis. There is facet joint hypertrophy at L4-5 and L5-S1 bilaterally. The pedicles and transverse processes are intact. There are coarse rounded calcifications that project approximately 3-4 cm above the superior aspect of the left iliac crest. These appear lie below the level of the kidneys. There is a approximately 8 x 18 mm calcification overlying the right of the fifth sacral segment. IMPRESSION: There is minimal narrowing at the L4-5 disc space. There is no compression fracture nor other acute lumbar spine  abnormality. There is mild facet joint hypertrophy at L4-5 and at L5-S1. If there are clinical concerns of instability, flexion and extension lateral views of the lumbar spine would be useful. Electronically Signed   By: David  Martinique M.D.   On: 05/31/2015 13:09    Assessment and Plan:   Low back pain with sciatica, sciatica laterality unspecified, unspecified back pain laterality - Plan: DG Lumbar Spine Complete  >25 minutes spent in face to face time with patient, >50% spent in counselling or coordination of care  Anatomy reviewed. Conservative algorithms for acute back pain generally begin with the following: NSAIDS, Muscle Relaxants, Mild pain medication if needed  Start with medications, core rehab, and progress from there following low back pain algorithm. No red flags are present.  Cont with rehab  Follow-up: prn  Orders Placed This Encounter  Procedures  . DG Lumbar Spine Complete    Signed,  Kenzo Ozment T. Brooklynn Brandenburg, MD   Patient's Medications  New Prescriptions  No medications on file  Previous Medications   CETIRIZINE (ZYRTEC) 10 MG TABLET    Take 10 mg by mouth daily.   CIPROFLOXACIN (CIPRO) 500 MG TABLET    Take 1 tablet (500 mg total) by mouth 2 (two) times daily.   CYCLOBENZAPRINE (FLEXERIL) 5 MG TABLET    Take 1 tablet (5 mg total) by mouth at bedtime as needed for muscle spasms.   IBUPROFEN (ADVIL,MOTRIN) 200 MG TABLET    Take 200 mg by mouth as needed.   MULTIPLE VITAMIN (MULTIVITAMIN) TABLET    Take 1 tablet by mouth daily.   OMEPRAZOLE (PRILOSEC OTC) 20 MG TABLET    Take 20 mg by mouth daily.  Modified Medications   No medications on file  Discontinued Medications   No medications on file

## 2015-05-31 NOTE — Progress Notes (Signed)
Pre visit review using our clinic review tool, if applicable. No additional management support is needed unless otherwise documented below in the visit note. 

## 2015-10-06 ENCOUNTER — Encounter: Payer: Self-pay | Admitting: Family Medicine

## 2015-10-06 ENCOUNTER — Ambulatory Visit (INDEPENDENT_AMBULATORY_CARE_PROVIDER_SITE_OTHER): Payer: BLUE CROSS/BLUE SHIELD | Admitting: Family Medicine

## 2015-10-06 VITALS — BP 128/82 | HR 72 | Temp 98.3°F | Wt 136.8 lb

## 2015-10-06 DIAGNOSIS — J019 Acute sinusitis, unspecified: Secondary | ICD-10-CM | POA: Insufficient documentation

## 2015-10-06 MED ORDER — AMOXICILLIN-POT CLAVULANATE 875-125 MG PO TABS: 1.0000 | ORAL_TABLET | Freq: Two times a day (BID) | ORAL | Status: AC

## 2015-10-06 MED ORDER — FLUTICASONE PROPIONATE 50 MCG/ACT NA SUSP: 2.0000 | Freq: Every day | NASAL | Status: DC

## 2015-10-06 NOTE — Patient Instructions (Addendum)
You have a sinus infection and inflammation. Take medicine as prescribed: augmentin 10 day course and start flonase nasal steroid.  Push fluids and plenty of rest. Nasal saline irrigation or neti pot to help drain sinuses. Please let us know if fever >101.5, trouble opening/closing mouth, difficulty swallowing, or worsening instead of improving as expected.

## 2015-10-06 NOTE — Assessment & Plan Note (Signed)
Sinus inflammation with likely infection.  Anticipate bacterial sinusitis - treat with augmentin course given duration of symptoms. Pt states she tolerates augmentin well despite penicillin intolerance listed in chart. Discussed further supportive care as per instructions. Start flonase nasal steroid.  Update if not improving with treatment.

## 2015-10-06 NOTE — Progress Notes (Signed)
Pre visit review using our clinic review tool, if applicable. No additional management support is needed unless otherwise documented below in the visit note. 

## 2015-10-06 NOTE — Progress Notes (Signed)
BP 128/82 mmHg  Pulse 72  Temp(Src) 98.3 F (36.8 C) (Oral)  Wt 136 lb 12 oz (62.029 kg)  LMP 03/02/2013   CC: ?sinusitis  Subjective:    Patient ID: Samantha Howell, female    DOB: 1966/01/17, 50 y.o.   MRN: [id number]  HPI: Samantha Howell is a 50 y.o. female presenting on 10/06/2015 for Sinusitis   2 month history of intermittent head/sinus congestion, thick PNdrainage, R sided headache/fullness and R earache. Pressure in forehead and behind eyes as well. Mild cough and ST present as well. More persistent discomfort/pressure over last 2 weeks.   No fevers/chills, tooth pain, abd pain, nausea.   Has tried pseudophed and mucinex (caused GI upset). Also using saline solution.  Takes zyrtec daily. Has tried ibuprofen as well.  No sick contacts at home. Husband vapes at home, not around smokers.  + allergic rhinitis. No h/o asthma. No h/o recurrent sinus infections.   Relevant past medical, surgical, family and social history reviewed and updated as indicated. Interim medical history since our last visit reviewed. Allergies and medications reviewed and updated. Current Outpatient Prescriptions on File Prior to Visit  Medication Sig  . cetirizine (ZYRTEC) 10 MG tablet Take 10 mg by mouth daily.  Marland Kitchen ibuprofen (ADVIL,MOTRIN) 200 MG tablet Take 200 mg by mouth as needed.  . Multiple Vitamin (MULTIVITAMIN) tablet Take 1 tablet by mouth daily. Reported on 10/06/2015  . omeprazole (PRILOSEC OTC) 20 MG tablet Take 20 mg by mouth daily.   No current facility-administered medications on file prior to visit.    Review of Systems Per HPI unless specifically indicated in ROS section     Objective:    BP 128/82 mmHg  Pulse 72  Temp(Src) 98.3 F (36.8 C) (Oral)  Wt 136 lb 12 oz (62.029 kg)  LMP 03/02/2013  Wt Readings from Last 3 Encounters:  10/06/15 136 lb 12 oz (62.029 kg)  05/31/15 137 lb (62.143 kg)  09/14/14 135 lb (61.236 kg)    Physical Exam  Constitutional: She appears  well-developed and well-nourished. No distress.  HENT:  Head: Normocephalic and atraumatic.  Right Ear: Hearing, tympanic membrane, external ear and ear canal normal.  Left Ear: Hearing, tympanic membrane, external ear and ear canal normal.  Nose: Mucosal edema present. No rhinorrhea. Right sinus exhibits no maxillary sinus tenderness and no frontal sinus tenderness. Left sinus exhibits no maxillary sinus tenderness and no frontal sinus tenderness.  Mouth/Throat: Uvula is midline, oropharynx is clear and moist and mucous membranes are normal. No oropharyngeal exudate, posterior oropharyngeal edema, posterior oropharyngeal erythema or tonsillar abscesses.  R nasal mucosal injection/inflammation and congestion  Eyes: Conjunctivae and EOM are normal. Pupils are equal, round, and reactive to light. No scleral icterus.  Neck: Normal range of motion. Neck supple.  Cardiovascular: Normal rate, regular rhythm, normal heart sounds and intact distal pulses.   No murmur heard. Pulmonary/Chest: Effort normal and breath sounds normal. No respiratory distress. She has no wheezes. She has no rales.  Lymphadenopathy:    She has no cervical adenopathy.  Skin: Skin is warm and dry. No rash noted.  Nursing note and vitals reviewed.      Assessment & Plan:   Problem List Items Addressed This Visit    Acute infection of nasal sinus - Primary    Sinus inflammation with likely infection.  Anticipate bacterial sinusitis - treat with augmentin course given duration of symptoms. Pt states she tolerates augmentin well despite penicillin intolerance listed in chart.  Discussed further supportive care as per instructions. Start flonase nasal steroid.  Update if not improving with treatment.      Relevant Medications   amoxicillin-clavulanate (AUGMENTIN) 875-125 MG tablet   fluticasone (FLONASE) 50 MCG/ACT nasal spray       Follow up plan: Return if symptoms worsen or fail to improve.

## 2016-03-10 ENCOUNTER — Encounter: Payer: Self-pay | Admitting: Internal Medicine

## 2016-03-10 ENCOUNTER — Ambulatory Visit (INDEPENDENT_AMBULATORY_CARE_PROVIDER_SITE_OTHER): Payer: BLUE CROSS/BLUE SHIELD | Admitting: Internal Medicine

## 2016-03-10 ENCOUNTER — Other Ambulatory Visit: Payer: Self-pay | Admitting: Internal Medicine

## 2016-03-10 ENCOUNTER — Encounter: Payer: Self-pay | Admitting: Gastroenterology

## 2016-03-10 VITALS — BP 112/72 | HR 62 | Temp 98.0°F | Ht 59.5 in | Wt 139.0 lb

## 2016-03-10 DIAGNOSIS — Z1231 Encounter for screening mammogram for malignant neoplasm of breast: Secondary | ICD-10-CM

## 2016-03-10 DIAGNOSIS — K219 Gastro-esophageal reflux disease without esophagitis: Secondary | ICD-10-CM | POA: Diagnosis not present

## 2016-03-10 DIAGNOSIS — J301 Allergic rhinitis due to pollen: Secondary | ICD-10-CM | POA: Diagnosis not present

## 2016-03-10 DIAGNOSIS — Z0000 Encounter for general adult medical examination without abnormal findings: Secondary | ICD-10-CM | POA: Diagnosis not present

## 2016-03-10 DIAGNOSIS — Z124 Encounter for screening for malignant neoplasm of cervix: Secondary | ICD-10-CM

## 2016-03-10 DIAGNOSIS — Z1211 Encounter for screening for malignant neoplasm of colon: Secondary | ICD-10-CM

## 2016-03-10 DIAGNOSIS — E785 Hyperlipidemia, unspecified: Secondary | ICD-10-CM | POA: Diagnosis not present

## 2016-03-10 MED ORDER — PROMETHAZINE HCL 25 MG PO TABS: 25.0000 mg | ORAL_TABLET | Freq: Four times a day (QID) | ORAL | 0 refills | Status: DC | PRN

## 2016-03-10 NOTE — Assessment & Plan Note (Signed)
Okay with current regimen

## 2016-03-10 NOTE — Progress Notes (Signed)
Pre visit review using our clinic review tool, if applicable. No additional management support is needed unless otherwise documented below in the visit note. 

## 2016-03-10 NOTE — Assessment & Plan Note (Signed)
Healthy Discussed fitness She gets flu vaccines Will set up colon mammo due 1/18 Pap done today

## 2016-03-10 NOTE — Addendum Note (Signed)
Addended by: Pilar Grammes on: 03/10/2016 11:36 AM   Modules accepted: Orders

## 2016-03-10 NOTE — Patient Instructions (Signed)
You are due for your mammogram in January  DASH Eating Plan DASH stands for "Dietary Approaches to Stop Hypertension." The DASH eating plan is a healthy eating plan that has been shown to reduce high blood pressure (hypertension). Additional health benefits may include reducing the risk of type 2 diabetes mellitus, heart disease, and stroke. The DASH eating plan may also help with weight loss. WHAT DO I NEED TO KNOW ABOUT THE DASH EATING PLAN? For the DASH eating plan, you will follow these general guidelines:  Choose foods with a percent daily value for sodium of less than 5% (as listed on the food label).  Use salt-free seasonings or herbs instead of table salt or sea salt.  Check with your health care provider or pharmacist before using salt substitutes.  Eat lower-sodium products, often labeled as "lower sodium" or "no salt added."  Eat fresh foods.  Eat more vegetables, fruits, and low-fat dairy products.  Choose whole grains. Look for the word "whole" as the first word in the ingredient list.  Choose fish and skinless chicken or [place] more often than red meat. Limit fish, poultry, and meat to 6 oz (170 g) each day.  Limit sweets, desserts, sugars, and sugary drinks.  Choose heart-healthy fats.  Limit cheese to 1 oz (28 g) per day.  Eat more home-cooked food and less restaurant, buffet, and fast food.  Limit fried foods.  Cook foods using methods other than frying.  Limit canned vegetables. If you do use them, rinse them well to decrease the sodium.  When eating at a restaurant, ask that your food be prepared with less salt, or no salt if possible. WHAT FOODS CAN I EAT? Seek help from a dietitian for individual calorie needs. Grains Whole grain or whole wheat bread. Brown rice. Whole grain or whole wheat pasta. Quinoa, bulgur, and whole grain cereals. Low-sodium cereals. Corn or whole wheat flour tortillas. Whole grain cornbread. Whole grain crackers. Low-sodium  crackers. Vegetables Fresh or frozen vegetables (raw, steamed, roasted, or grilled). Low-sodium or reduced-sodium tomato and vegetable juices. Low-sodium or reduced-sodium tomato sauce and paste. Low-sodium or reduced-sodium canned vegetables.  Fruits All fresh, canned (in natural juice), or frozen fruits. Meat and Other Protein Products Ground beef (85% or leaner), grass-fed beef, or beef trimmed of fat. Skinless chicken or [place]. Ground chicken or [place]. Pork trimmed of fat. All fish and seafood. Eggs. Dried beans, peas, or lentils. Unsalted nuts and seeds. Unsalted canned beans. Dairy Low-fat dairy products, such as skim or 1% milk, 2% or reduced-fat cheeses, low-fat ricotta or cottage cheese, or plain low-fat yogurt. Low-sodium or reduced-sodium cheeses. Fats and Oils Tub margarines without trans fats. Light or reduced-fat mayonnaise and salad dressings (reduced sodium). Avocado. Safflower, olive, or canola oils. Natural peanut or almond butter. Other Unsalted popcorn and pretzels. The items listed above may not be a complete list of recommended foods or beverages. Contact your dietitian for more options. WHAT FOODS ARE NOT RECOMMENDED? Grains White bread. White pasta. White rice. Refined cornbread. Bagels and croissants. Crackers that contain trans fat. Vegetables Creamed or fried vegetables. Vegetables in a cheese sauce. Regular canned vegetables. Regular canned tomato sauce and paste. Regular tomato and vegetable juices. Fruits Dried fruits. Canned fruit in light or heavy syrup. Fruit juice. Meat and Other Protein Products Fatty cuts of meat. Ribs, chicken wings, bacon, sausage, bologna, salami, chitterlings, fatback, hot dogs, bratwurst, and packaged luncheon meats. Salted nuts and seeds. Canned beans with salt. Dairy Whole or 2% milk, cream,  half-and-half, and cream cheese. Whole-fat or sweetened yogurt. Full-fat cheeses or blue cheese. Nondairy creamers and whipped toppings.  Processed cheese, cheese spreads, or cheese curds. Condiments Onion and garlic salt, seasoned salt, table salt, and sea salt. Canned and packaged gravies. Worcestershire sauce. Tartar sauce. Barbecue sauce. Teriyaki sauce. Soy sauce, including reduced sodium. Steak sauce. Fish sauce. Oyster sauce. Cocktail sauce. Horseradish. Ketchup and mustard. Meat flavorings and tenderizers. Bouillon cubes. Hot sauce. Tabasco sauce. Marinades. Taco seasonings. Relishes. Fats and Oils Butter, stick margarine, lard, shortening, ghee, and bacon fat. Coconut, palm kernel, or palm oils. Regular salad dressings. Other Pickles and olives. Salted popcorn and pretzels. The items listed above may not be a complete list of foods and beverages to avoid. Contact your dietitian for more information. WHERE CAN I FIND MORE INFORMATION? National Heart, Lung, and Blood Institute: travelstabloid.com   This information is not intended to replace advice given to you by your health care provider. Make sure you discuss any questions you have with your health care provider.   Document Released: 07/06/2011 Document Revised: 08/07/2014 Document Reviewed: 05/21/2013 Elsevier Interactive Patient Education [employer].

## 2016-03-10 NOTE — Assessment & Plan Note (Signed)
Discussed healthy lifestyle She prefers no statin for primary prevention--though may consider if LDL over 190

## 2016-03-10 NOTE — Assessment & Plan Note (Signed)
Okay with Rx

## 2016-03-10 NOTE — Progress Notes (Signed)
Subjective:    Patient ID: Samantha Howell, female    DOB: [birth year], 50 y.o.   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 cetirizine (ZYRTEC) 10 MG tablet Take 10 mg by mouth daily.    . fluticasone (FLONASE) 50 MCG/ACT nasal spray Place 2 sprays into both nostrils daily. 16 g 1  . ibuprofen (ADVIL,MOTRIN) 200 MG tablet Take 200 mg by mouth as needed.    . Multiple Vitamin (MULTIVITAMIN) tablet Take 1 tablet by mouth daily. Reported on 10/06/2015    . omeprazole (PRILOSEC OTC) 20 MG tablet Take 20 mg by mouth daily.     No current facility-administered medications on file prior to visit.     Allergies  Allergen Reactions  . Penicillins Nausea Only  . Sulfonamide Derivatives Hives    Happened when she was a child.    Past Medical History:  Diagnosis Date  . Abdominal pain   . Allergic rhinitis   . Fibrocystic breast   . GERD (gastroesophageal reflux disease)   . HLD (hyperlipidemia)   . Migraines   . Nausea     Past Surgical History:  Procedure Laterality Date  . BREAST LUMPECTOMY  12/00   Left breast  . CHOLECYSTECTOMY  06/13/2011   Procedure: LAPAROSCOPIC CHOLECYSTECTOMY WITH INTRAOPERATIVE CHOLANGIOGRAM;  Surgeon: Adin Hector, MD;  Location: [facility];  Service: General;  Laterality: N/A;  . TUBAL LIGATION  1992    Family History  Problem Relation Age of Onset  . Heart attack Father   . Hyperlipidemia Father   . Hearing loss Father   . Early menopause Mother   . Ovarian cancer Paternal Aunt   . Cancer Paternal Aunt    ovarian  . Cancer Maternal Grandmother     Bladder, breast  . Breast cancer      MGGM  . Crohn's disease Sister     Social History   Social History  . Marital status: Married    Spouse name: N/A  . Number of children: 2  . Years of education: N/A   Occupational History  . IT [employer]   Social History Main Topics  . Smoking status: Former Smoker    Quit date: 07/31/1996  . Smokeless tobacco: Never Used  . Alcohol use 0.0 oz/week     Comment: Occasional  . Drug use: No  . Sexual activity: Not on file   Other Topics Concern  . 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       Now seeing trainer Wears seat belt  HENT: Negative for dental problem, hearing loss and tinnitus.        Keeps up with dentist  Eyes: Negative for visual disturbance.       No diplopia or unilateral vision loss  Respiratory: Negative for cough, chest tightness and shortness of breath.   Cardiovascular: Negative for  chest pain, palpitations and leg swelling.  Gastrointestinal: Negative for abdominal pain, blood in stool, constipation, nausea and vomiting.       No heartburn  Genitourinary: Positive for difficulty urinating. Negative for dyspareunia, dysuria and frequency.       Has to strain to empty bladder at times Some right pelvic pain still  Musculoskeletal: Negative for arthralgias, back pain and joint swelling.  Skin: Negative for rash.       No suspicious lesions  Allergic/Immunologic: Positive for environmental allergies. Negative for immunocompromised state.       Satisfied with Rx  Neurological: Negative for dizziness, syncope, weakness, light-headedness and headaches.  Hematological: Negative for adenopathy. Does not bruise/bleed easily.  Psychiatric/Behavioral: Negative for dysphoric mood and suicidal ideas. The patient is not  nervous/anxious.        Objective:   Physical Exam  Constitutional: She is oriented to person, place, and time. She appears well-developed and well-nourished. No distress.  HENT:  Head: Normocephalic and atraumatic.  Right Ear: External ear normal.  Left Ear: External ear normal.  Mouth/Throat: Oropharynx is clear and moist. No oropharyngeal exudate.  Eyes: Conjunctivae are normal. Pupils are equal, round, and reactive to light.  Neck: Normal range of motion. Neck supple. No thyromegaly present.  Cardiovascular: Normal rate, regular rhythm, normal heart sounds and intact distal pulses.  Exam reveals no gallop.   No murmur heard. Pulmonary/Chest: Effort normal and breath sounds normal. No respiratory distress. She has no wheezes. She has no rales.  Abdominal: Soft. There is no tenderness.  Genitourinary:  Genitourinary Comments: Normal introitus and cervix Pap done  Musculoskeletal: She exhibits no edema or tenderness.  Lymphadenopathy:    She has no cervical adenopathy.  Neurological: She is alert and oriented to person, place, and time.  Skin: No rash noted. No erythema.  Psychiatric: She has a normal mood and affect. Her behavior is normal.          Assessment & Plan:

## 2016-03-11 LAB — COMPREHENSIVE METABOLIC PANEL
ALT: 18 IU/L (ref 0–32)
AST: 16 IU/L (ref 0–40)
Albumin/Globulin Ratio: 2 (ref 1.2–2.2)
Albumin: 4.6 g/dL (ref 3.5–5.5)
Alkaline Phosphatase: 78 IU/L (ref 39–117)
BUN/Creatinine Ratio: 14 (ref 9–23)
BUN: 12 mg/dL (ref 6–24)
Bilirubin Total: 0.3 mg/dL (ref 0.0–1.2)
CALCIUM: 9.7 mg/dL (ref 8.7–10.2)
CHLORIDE: 102 mmol/L (ref 96–106)
CO2: 25 mmol/L (ref 18–29)
Creatinine, Ser: 0.83 mg/dL (ref 0.57–1.00)
GFR, EST AFRICAN AMERICAN: 94 mL/min/{1.73_m2} (ref >59)
GFR, EST NON AFRICAN AMERICAN: 82 mL/min/{1.73_m2} (ref >59)
GLUCOSE: 85 mg/dL (ref 65–99)
Globulin, Total: 2.3 g/dL (ref 1.5–4.5)
Potassium: 4.1 mmol/L (ref 3.5–5.2)
Sodium: 145 mmol/L — ABNORMAL HIGH (ref 134–144)
TOTAL PROTEIN: 6.9 g/dL (ref 6.0–8.5)

## 2016-03-11 LAB — CBC WITH DIFFERENTIAL/PLATELET
BASOS ABS: 0 10*3/uL (ref 0.0–0.2)
BASOS: 0 %
EOS (ABSOLUTE): 0.2 10*3/uL (ref 0.0–0.4)
EOS: 3 %
Hematocrit: 41.6 % (ref 34.0–46.6)
Hemoglobin: 13.6 g/dL (ref 11.1–15.9)
IMMATURE GRANS (ABS): 0 10*3/uL (ref 0.0–0.1)
Immature Granulocytes: 0 %
LYMPHS: 42 %
Lymphocytes Absolute: 2.4 10*3/uL (ref 0.7–3.1)
MCH: 27.5 pg (ref 26.6–33.0)
MCHC: 32.7 g/dL (ref 31.5–35.7)
MCV: 84 fL (ref 79–97)
MONOCYTES: 8 %
Monocytes Absolute: 0.4 10*3/uL (ref 0.1–0.9)
NEUTROS ABS: 2.7 10*3/uL (ref 1.4–7.0)
Neutrophils: 47 %
Platelets: 239 10*3/uL (ref 150–379)
RBC: 4.95 x10E6/uL (ref 3.77–5.28)
RDW: 14.2 % (ref 12.3–15.4)
WBC: 5.7 10*3/uL (ref 3.4–10.8)

## 2016-03-11 LAB — LIPID PANEL
CHOLESTEROL TOTAL: 255 mg/dL — AB (ref 100–199)
Chol/HDL Ratio: 4.9 ratio units — ABNORMAL HIGH (ref 0.0–4.4)
HDL: 52 mg/dL (ref >39)
LDL CALC: 159 mg/dL — AB (ref 0–99)
Triglycerides: 218 mg/dL — ABNORMAL HIGH (ref 0–149)
VLDL CHOLESTEROL CAL: 44 mg/dL — AB (ref 5–40)

## 2016-03-15 LAB — PAP LB AND HPV HIGH-RISK
HPV, high-risk: NEGATIVE
PAP Smear Comment: 0

## 2016-03-23 ENCOUNTER — Ambulatory Visit
Admission: RE | Admit: 2016-03-23 | Discharge: 2016-03-23 | Disposition: A | Discharge status: Home / Self Care | Payer: BLUE CROSS/BLUE SHIELD | Source: Ambulatory Visit | Attending: Internal Medicine | Admitting: Internal Medicine

## 2016-03-23 DIAGNOSIS — Z1231 Encounter for screening mammogram for malignant neoplasm of breast: Secondary | ICD-10-CM

## 2016-04-14 ENCOUNTER — Ambulatory Visit: Payer: BLUE CROSS/BLUE SHIELD | Admitting: *Deleted

## 2016-04-14 VITALS — Ht 59.0 in | Wt 135.8 lb

## 2016-04-14 DIAGNOSIS — Z1211 Encounter for screening for malignant neoplasm of colon: Secondary | ICD-10-CM

## 2016-04-14 MED ORDER — SUPREP BOWEL PREP KIT 17.5-3.13-1.6 GM/177ML PO SOLN: 1.0000 | Freq: Once | ORAL | 0 refills | Status: AC

## 2016-04-14 NOTE — Progress Notes (Signed)
Patient denies any allergies to egg or soy products. Patient denies complications with anesthesia/sedation.  Patient denies oxygen use at home and denies diet medications. Emmi instructions for colonoscopy explained but patient refused.

## 2016-04-18 ENCOUNTER — Encounter: Payer: Self-pay | Admitting: Gastroenterology

## 2016-04-28 ENCOUNTER — Encounter: Payer: Self-pay | Admitting: Gastroenterology

## 2016-04-28 ENCOUNTER — Ambulatory Visit (AMBULATORY_SURGERY_CENTER): Payer: BLUE CROSS/BLUE SHIELD | Admitting: Gastroenterology

## 2016-04-28 VITALS — BP 111/74 | HR 66 | Temp 98.0°F | Resp 10 | Ht 59.0 in | Wt 135.0 lb

## 2016-04-28 DIAGNOSIS — D120 Benign neoplasm of cecum: Secondary | ICD-10-CM

## 2016-04-28 DIAGNOSIS — Z1211 Encounter for screening for malignant neoplasm of colon: Secondary | ICD-10-CM | POA: Diagnosis present

## 2016-04-28 MED ORDER — SODIUM CHLORIDE 0.9 % IV SOLN: 500.0000 mL | INTRAVENOUS | Status: DC

## 2016-04-28 NOTE — Progress Notes (Signed)
Report given to PACU RN, vss 

## 2016-04-28 NOTE — Op Note (Signed)
[facility] Patient Name: Samantha Howell Procedure Date: 04/28/2016 9:46 AM MRN: [id number] Endoscopist: Mauri Pole , MD Age: 50 Referring MD:  Date of Birth: 11-06-1965 Gender: Female Account #: [id number] Procedure:                Colonoscopy Indications:              Screening for colorectal malignant neoplasm, This                            is the patient's first colonoscopy Medicines:                Monitored Anesthesia Care Procedure:                Pre-Anesthesia Assessment:                           - Prior to the procedure, a History and Physical                            was performed, and patient medications and                            allergies were reviewed. The patient's tolerance of                            previous anesthesia was also reviewed. The risks                            and benefits of the procedure and the sedation                            options and risks were discussed with the patient.                            All questions were answered, and informed consent                            was obtained. Prior Anticoagulants: The patient has                            taken no previous anticoagulant or antiplatelet                            agents. ASA Grade Assessment: II - A patient with                            mild systemic disease. After reviewing the risks                            and benefits, the patient was deemed in                            satisfactory condition to undergo the procedure.  After obtaining informed consent, the colonoscope                            was passed under direct vision. Throughout the                            procedure, the patient's blood pressure, pulse, and                            oxygen saturations were monitored continuously. The                            Model CF-HQ190L [id number]) scope was introduced                            through the anus and  advanced to the the terminal                            ileum, with identification of the appendiceal                            orifice and IC valve. The colonoscopy was performed                            without difficulty. The patient tolerated the                            procedure well. The quality of the bowel                            preparation was excellent. The terminal ileum,                            ileocecal valve, appendiceal orifice, and rectum                            were photographed. Scope In: 9:53:09 AM Scope Out: 10:02:19 AM Scope Withdrawal Time: 0 hours 6 minutes 4 seconds  Total Procedure Duration: 0 hours 9 minutes 10 seconds  Findings:                 The perianal and digital rectal examinations were                            normal.                           A 2 mm polyp was found in the cecum. The polyp was                            sessile. The polyp was removed with a cold biopsy                            forceps. Resection and retrieval were complete.  Non-bleeding internal hemorrhoids were found during                            retroflexion. The hemorrhoids were large.                           The exam was otherwise without abnormality. Complications:            No immediate complications. Estimated Blood Loss:     Estimated blood loss was minimal. Impression:               - One 2 mm polyp in the cecum, removed with a cold                            biopsy forceps. Resected and retrieved.                           - Non-bleeding internal hemorrhoids.                           - The examination was otherwise normal. Recommendation:           - Patient has a contact number available for                            emergencies. The signs and symptoms of potential                            delayed complications were discussed with the                            patient. Return to normal activities tomorrow.                             Written discharge instructions were provided to the                            patient.                           - Resume previous diet.                           - Continue present medications.                           - Await pathology results.                           - Repeat colonoscopy in 5-10 years for surveillance                            based on pathology results.                           - Return to GI clinic PRN for hemorrhoidal band  ligation if symptomatic Mauri Pole, MD 04/28/2016 10:06:04 AM This report has been signed electronically.

## 2016-04-28 NOTE — Progress Notes (Signed)
Called to room to assist during endoscopic procedure.  Patient ID and intended procedure confirmed with present staff. Received instructions for my participation in the procedure from the performing physician.  

## 2016-04-28 NOTE — Patient Instructions (Signed)
YOU HAD AN ENDOSCOPIC PROCEDURE TODAY AT THE [facility]:   Refer to the procedure report that was given to you for any specific questions about what was found during the examination.  If the procedure report does not answer your questions, please call your gastroenterologist to clarify.  If you requested that your care partner not be given the details of your procedure findings, then the procedure report has been included in a sealed envelope for you to review at your convenience later.  YOU SHOULD EXPECT: Some feelings of bloating in the abdomen. Passage of more gas than usual.  Walking can help get rid of the air that was put into your GI tract during the procedure and reduce the bloating. If you had a lower endoscopy (such as a colonoscopy or flexible sigmoidoscopy) you may notice spotting of blood in your stool or on the toilet paper. If you underwent a bowel prep for your procedure, you may not have a normal bowel movement for a few days.  Please Note:  You might notice some irritation and congestion in your nose or some drainage.  This is from the oxygen used during your procedure.  There is no need for concern and it should clear up in a day or so.  SYMPTOMS TO REPORT IMMEDIATELY:   Following lower endoscopy (colonoscopy or flexible sigmoidoscopy):  Excessive amounts of blood in the stool  Significant tenderness or worsening of abdominal pains  Swelling of the abdomen that is new, acute  Fever of 100F or higher   Following upper endoscopy (EGD)  Vomiting of blood or coffee ground material  New chest pain or pain under the shoulder blades  Painful or persistently difficult swallowing  New shortness of breath  Fever of 100F or higher  Black, tarry-looking stools  For urgent or emergent issues, a gastroenterologist can be reached at any hour by calling [phone].   DIET:  We do recommend a small meal at first, but then you may proceed to your regular diet.  Drink  plenty of fluids but you should avoid alcoholic beverages for 24 hours.  ACTIVITY:  You should plan to take it easy for the rest of today and you should NOT DRIVE or use heavy machinery until tomorrow (because of the sedation medicines used during the test).    FOLLOW UP: Our staff will call the number listed on your records the next business day following your procedure to check on you and address any questions or concerns that you may have regarding the information given to you following your procedure. If we do not reach you, we will leave a message.  However, if you are feeling well and you are not experiencing any problems, there is no need to return our call.  We will assume that you have returned to your regular daily activities without incident.  If any biopsies were taken you will be contacted by phone or by letter within the next 1-3 weeks.  Please call us at [phone] if you have not heard about the biopsies in 3 weeks.    SIGNATURES/CONFIDENTIALITY: You and/or your care partner have signed paperwork which will be entered into your electronic medical record.  These signatures attest to the fact that that the information above on your After Visit Summary has been reviewed and is understood.  Full responsibility of the confidentiality of this discharge information lies with you and/or your care-partner.  Polyp and hemorrhoid information given. 

## 2016-05-01 ENCOUNTER — Telehealth: Payer: Self-pay

## 2016-05-01 NOTE — Telephone Encounter (Signed)
  Follow up Call-  Call back number 04/28/2016  Post procedure Call Back phone  # [phone]  Permission to leave phone message Yes  Some recent data might be hidden     Patient questions:  Do you have a fever, pain , or abdominal swelling? No. Pain Score  0 *  Have you tolerated food without any problems? Yes.    Have you been able to return to your normal activities? Yes.    Do you have any questions about your discharge instructions: Diet   No. Medications  No. Follow up visit  No.  Do you have questions or concerns about your Care? No.  Actions: * If pain score is 4 or above: No action needed, pain <4.

## 2016-05-09 ENCOUNTER — Encounter: Payer: Self-pay | Admitting: Gastroenterology

## 2016-05-16 ENCOUNTER — Telehealth: Payer: Self-pay | Admitting: Gastroenterology

## 2016-05-16 NOTE — Telephone Encounter (Signed)
Letter reviewed with the patient.

## 2016-05-16 NOTE — Telephone Encounter (Signed)
Called back to phone. Left a message to call back. A result letter was also mailed.

## 2016-06-16 DIAGNOSIS — D2362 Other benign neoplasm of skin of left upper limb, including shoulder: Secondary | ICD-10-CM | POA: Diagnosis not present

## 2017-02-26 ENCOUNTER — Other Ambulatory Visit: Payer: Self-pay | Admitting: Internal Medicine

## 2017-02-26 DIAGNOSIS — Z1231 Encounter for screening mammogram for malignant neoplasm of breast: Secondary | ICD-10-CM

## 2017-03-13 ENCOUNTER — Ambulatory Visit (INDEPENDENT_AMBULATORY_CARE_PROVIDER_SITE_OTHER): Payer: BLUE CROSS/BLUE SHIELD | Admitting: Internal Medicine

## 2017-03-13 ENCOUNTER — Encounter: Payer: Self-pay | Admitting: Internal Medicine

## 2017-03-13 VITALS — BP 122/84 | HR 68 | Temp 98.1°F | Ht 59.5 in | Wt 137.5 lb

## 2017-03-13 DIAGNOSIS — Z0001 Encounter for general adult medical examination with abnormal findings: Secondary | ICD-10-CM | POA: Diagnosis not present

## 2017-03-13 DIAGNOSIS — K219 Gastro-esophageal reflux disease without esophagitis: Secondary | ICD-10-CM | POA: Diagnosis not present

## 2017-03-13 DIAGNOSIS — Z0000 Encounter for general adult medical examination without abnormal findings: Secondary | ICD-10-CM

## 2017-03-13 DIAGNOSIS — G4489 Other headache syndrome: Secondary | ICD-10-CM | POA: Insufficient documentation

## 2017-03-13 NOTE — Assessment & Plan Note (Signed)
More noticeable over past 3 years ?hormonal Analgesic related? ?part of post herpetic syndrome that then triggers migraine Will set up with headache specialist

## 2017-03-13 NOTE — Assessment & Plan Note (Signed)
Healthy Colon due 2022 Getting mammogram soon---prefers yearly UTD on vaccines--needs flu this year (gets at work) Pap due 2022

## 2017-03-13 NOTE — Assessment & Plan Note (Signed)
Needs PPI Didn't do well weaning

## 2017-03-13 NOTE — Progress Notes (Signed)
Subjective:    Patient ID: Samantha Howell, female    DOB: July 08, 1966, 51 y.o.   MRN: [id number]  HPI Here for physical  Ongoing issues with headaches Gets dizzy at times---but not with headaches Headaches usually awaken her at night--pounding headache. Phenergan helps and will let her go back to sleep.  Average one every other month--for bad ones Often awakens with mild headache every morning Takes tylenol or advil--- usually before bed (most nights and it will prevent the serious headaches---she can tell if she is predisposed to bad HA)  Needs to rework her glasses for work Had shingles on left head--several years ago This is where she gets the headaches (but then it will general  Current Outpatient Prescriptions on File Prior to Visit  Medication Sig Dispense Refill  . cetirizine (ZYRTEC) 10 MG tablet Take 10 mg by mouth daily.    . fluticasone (FLONASE) 50 MCG/ACT nasal spray Place 2 sprays into both nostrils daily. 16 g 1  . ibuprofen (ADVIL,MOTRIN) 200 MG tablet Take 200 mg by mouth as needed.    . Multiple Vitamin (MULTIVITAMIN) tablet Take 1 tablet by mouth daily. Reported on 10/06/2015    . omeprazole (PRILOSEC OTC) 20 MG tablet Take 20 mg by mouth daily.    . promethazine (PHENERGAN) 25 MG tablet Take 1 tablet (25 mg total) by mouth every 6 (six) hours as needed for nausea or vomiting. 30 tablet 0   Current Facility-Administered Medications on File Prior to Visit  Medication Dose Route Frequency Provider Last Rate Last Dose  . 0.9 %  sodium chloride infusion  500 mL Intravenous Continuous Nandigam, Venia Minks, MD        Allergies  Allergen Reactions  . Penicillins Nausea Only  . Sulfonamide Derivatives Hives    Happened when she was a child.    Past Medical History:  Diagnosis Date  . Abdominal pain   . Allergic rhinitis   . Allergy   . Anemia    history  . Fibrocystic breast   . GERD (gastroesophageal reflux disease)   . HLD (hyperlipidemia)    diet  controlled, no meds  . Migraines   . Nausea   . SVD (spontaneous vaginal delivery)    x 2    Past Surgical History:  Procedure Laterality Date  . BREAST LUMPECTOMY  12/00   Left breast  . CHOLECYSTECTOMY  06/13/2011   Procedure: LAPAROSCOPIC CHOLECYSTECTOMY WITH INTRAOPERATIVE CHOLANGIOGRAM;  Surgeon: Adin Hector, MD;  Location: [facility];  Service: General;  Laterality: N/A;  . TUBAL LIGATION  1992  . WISDOM TOOTH EXTRACTION      Family History  Problem Relation Age of Onset  . Heart attack Father   . Hyperlipidemia Father   . Hearing loss Father   . Atrial fibrillation Father   . Stroke Father   . Early menopause Mother   . Arthritis Mother   . Ovarian cancer Paternal Aunt   . Cancer Paternal Aunt        ovarian  . Cancer Maternal Grandmother        Bladder, breast  . Breast cancer Unknown        MGGM  . Crohn's disease Sister   . Ulcerative colitis Sister   . Colon cancer Neg Hx   . Esophageal cancer Neg Hx   . Rectal cancer Neg Hx   . Stomach cancer Neg Hx     Social History   Social History  . Marital status: Married  Spouse name: N/A  . Number of children: 2  . Years of education: N/A   Occupational History  . IT [employer]   Social History Main Topics  . Smoking status: Former Smoker    Packs/day: 0.25    Years: 4.00    Types: Cigarettes    Quit date: 07/31/1996  . Smokeless tobacco: Never Used  . Alcohol use 0.0 oz/week     Comment: Occasional beer  . Drug use: No  . Sexual activity: Not on file   Other Topics Concern  . 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       No regular exercise--busy helping with mom's care Wears seat belt  HENT: Negative for dental problem, hearing loss, tinnitus and trouble swallowing.        Keeps up with dentist  Eyes: Negative for visual  disturbance.       No diplopia or unilateral vision loss  Respiratory: Negative for cough, chest tightness and shortness of breath.   Cardiovascular: Negative for chest pain, palpitations and leg swelling.  Gastrointestinal: Negative for abdominal pain, blood in stool and constipation.       No heartburn on the med. Didn't tolerate trying to wean it  Endocrine: Negative for polydipsia and polyuria.  Genitourinary: Negative for dyspareunia, dysuria and hematuria.  Musculoskeletal: Positive for back pain. Negative for arthralgias and joint swelling.  Skin: Negative for rash.       Some itching Keeps up with derm  Allergic/Immunologic: Positive for environmental allergies. Negative for immunocompromised state.       Satisfied with Rx  Neurological: Positive for dizziness and headaches. Negative for syncope.  Hematological: Negative for adenopathy. Does not bruise/bleed easily.  Psychiatric/Behavioral: Negative for dysphoric mood and sleep disturbance. The patient is not nervous/anxious.        Objective:   Physical Exam  Constitutional: She is oriented to person, place, and time. She appears well-nourished. No distress.  HENT:  Head: Normocephalic and atraumatic.  Right Ear: External ear normal.  Left Ear: External ear normal.  Mouth/Throat: Oropharynx is clear and moist. No oropharyngeal exudate.  Eyes: Pupils are equal, round, and reactive to light. Conjunctivae are normal.  Neck: Normal range of motion. Neck supple. No thyromegaly present.  Cardiovascular: Normal rate, regular rhythm, normal heart sounds and intact distal pulses.  Exam reveals no gallop.   No murmur heard. Pulmonary/Chest: Effort normal and breath sounds normal. No respiratory distress. She has no wheezes. She has no rales.  Abdominal: Soft. There is no tenderness.  Musculoskeletal: She exhibits no edema or tenderness.  Lymphadenopathy:    She has no cervical adenopathy.  Neurological: She is alert and oriented  to person, place, and time.  Skin: No rash noted. No erythema.  Psychiatric: She has a normal mood and affect. Her behavior is normal.          Assessment & Plan:

## 2017-03-26 ENCOUNTER — Ambulatory Visit
Admission: RE | Admit: 2017-03-26 | Discharge: 2017-03-26 | Disposition: A | Discharge status: Home / Self Care | Payer: BLUE CROSS/BLUE SHIELD | Source: Ambulatory Visit | Attending: Internal Medicine | Admitting: Internal Medicine

## 2017-03-26 DIAGNOSIS — Z1231 Encounter for screening mammogram for malignant neoplasm of breast: Secondary | ICD-10-CM | POA: Diagnosis not present

## 2017-05-09 DIAGNOSIS — G43839 Menstrual migraine, intractable, without status migrainosus: Secondary | ICD-10-CM | POA: Diagnosis not present

## 2017-05-09 DIAGNOSIS — Z79899 Other long term (current) drug therapy: Secondary | ICD-10-CM | POA: Diagnosis not present

## 2017-05-09 DIAGNOSIS — G43111 Migraine with aura, intractable, with status migrainosus: Secondary | ICD-10-CM | POA: Diagnosis not present

## 2017-05-09 DIAGNOSIS — G43719 Chronic migraine without aura, intractable, without status migrainosus: Secondary | ICD-10-CM | POA: Diagnosis not present

## 2017-05-09 DIAGNOSIS — Z049 Encounter for examination and observation for unspecified reason: Secondary | ICD-10-CM | POA: Diagnosis not present

## 2017-05-09 DIAGNOSIS — R51 Headache: Secondary | ICD-10-CM | POA: Diagnosis not present

## 2017-12-12 ENCOUNTER — Encounter: Payer: Self-pay | Admitting: Family Medicine

## 2017-12-12 ENCOUNTER — Other Ambulatory Visit: Payer: Self-pay

## 2017-12-12 ENCOUNTER — Ambulatory Visit: Payer: BLUE CROSS/BLUE SHIELD | Admitting: Family Medicine

## 2017-12-12 VITALS — BP 110/80 | HR 64 | Temp 98.3°F | Ht 59.5 in | Wt 132.8 lb

## 2017-12-12 DIAGNOSIS — M545 Low back pain, unspecified: Secondary | ICD-10-CM

## 2017-12-12 MED ORDER — TIZANIDINE HCL 2 MG PO CAPS: 2.0000 mg | ORAL_CAPSULE | Freq: Every day | ORAL | 1 refills | Status: DC

## 2017-12-12 MED ORDER — PROMETHAZINE HCL 25 MG PO TABS: 25.0000 mg | ORAL_TABLET | Freq: Four times a day (QID) | ORAL | 1 refills | Status: DC | PRN

## 2017-12-12 NOTE — Progress Notes (Signed)
Dr. Frederico Hamman T. Alcus Bradly, MD, [facility] [place], [address] Phone: [phone] Fax: [phone]  12/12/2017  Patient: Samantha Howell, MRN: [id number], DOB: 05-16-1966, 52 y.o.  Primary Physician:  Venia Carbon, MD   Chief Complaint  Patient presents with  . Back Pain    x 1 week   Subjective:   Samantha Howell is a 52 y.o. very pleasant female patient who presents with the following: Back Pain  ongoing for approximately: 10 d The patient has had back pain before. The back pain is localized into the lumbar spine area. They also describe no radiculopathy.  10 d ago, L side was pulling some and trouble sitting and some problems - tried some stretching and doing a little bit better. Sore but not to touch.   L big toe occ numb - long time.   No numbness or tingling. No bowel or bladder incontinence. No focal weakness. Prior interventions: none Physical therapy: No Chiropractic manipulations: No Acupuncture: No Osteopathic manipulation: No Heat or cold: Minimal effect  Past Medical History, Surgical History, Family History, Medications, Allergies have been reviewed and updated if relevant.  Patient Active Problem List   Diagnosis Date Noted  . Headache syndrome 03/13/2017  . Routine general medical examination at a health care facility 05/23/2011  . Hyperlipemia 01/04/2009  . Seasonal allergic rhinitis due to pollen 12/16/2007  . GERD 12/16/2007  . FIBROCYSTIC BREAST DISEASE 12/16/2007    Past Medical History:  Diagnosis Date  . Abdominal pain   . Allergic rhinitis   . Allergy   . Anemia    history  . Fibrocystic breast   . GERD (gastroesophageal reflux disease)   . HLD (hyperlipidemia)    diet controlled, no meds  . Migraines   . Nausea   . SVD (spontaneous vaginal delivery)    x 2    Past Surgical History:  Procedure Laterality Date  . BREAST EXCISIONAL BIOPSY Left 2001   benign  .  BREAST LUMPECTOMY  12/00   Left breast  . CHOLECYSTECTOMY  06/13/2011   Procedure: LAPAROSCOPIC CHOLECYSTECTOMY WITH INTRAOPERATIVE CHOLANGIOGRAM;  Surgeon: Adin Hector, MD;  Location: [facility];  Service: General;  Laterality: N/A;  . TUBAL LIGATION  1992  . WISDOM TOOTH EXTRACTION      Social History   Socioeconomic History  . Marital status: Married    Spouse name: Not on file  . Number of children: 2  . Years of education: Not on file  . Highest education level: Not on file  Occupational History  . 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 Financial resource strain: Not on file  . Food insecurity:    Worry: Not on file    Inability: Not on file  . Transportation needs:    Medical: Not on file    Non-medical: Not on file  Tobacco Use  . Smoking status: Former Smoker    Packs/day: 0.25    Years: 4.00    Pack years: 1.00    Types: Cigarettes    Last attempt to quit: 07/31/1996    Years since quitting: 21.3  . Smokeless tobacco: Never Used  Substance and Sexual Activity  . Alcohol use: Yes    Alcohol/week: 0.0 oz    Comment: Occasional beer  . Drug use: No  . Sexual activity: Not on file  Lifestyle  . Physical activity:    Days  per week: Not on file    Minutes per session: Not on file  . Stress: Not on file  Relationships  . Social connections:    Talks on phone: Not on file    Gets together: Not on file    Attends religious service: Not on file    Active member of club or organization: Not on file    Attends meetings of clubs or organizations: Not on file    Relationship status: Not on file  . Intimate partner violence:    Fear of current or ex partner: Not on file    Emotionally abused: Not on file    Physically abused: Not on file    Forced sexual activity: Not on file  Other Topics Concern  . 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 Heart attack Father   . Hyperlipidemia Father   . Hearing loss Father   . Atrial fibrillation Father   . Stroke Father   . Early menopause Mother   . Arthritis Mother   . Ovarian cancer Paternal Aunt   . Cancer Paternal Aunt        ovarian  . Cancer Maternal Grandmother        Bladder, breast  . Breast cancer Unknown        MGGM  . Crohn's disease Sister   . Ulcerative colitis Sister   . Colon cancer Neg Hx   . Esophageal cancer Neg Hx   . Rectal cancer Neg Hx   . Stomach cancer Neg Hx     Allergies  Allergen Reactions  . Penicillins Nausea Only  . Sulfonamide Derivatives Hives    Happened when she was a child.    Medication list reviewed and updated in full in [place].  GEN: No fevers, chills. Nontoxic. Primarily MSK c/o today. MSK: Detailed in the HPI GI: tolerating PO intake without difficulty Neuro: As above  Otherwise the pertinent positives of the ROS are noted above.    Objective:   Blood pressure 110/80, pulse 64, temperature 98.3 F (36.8 C), temperature source Oral, height 4' 11.5" (1.511 m), weight 132 lb 12 oz (60.2 kg), last menstrual period 03/02/2013.  Gen: Well-developed,well-nourished,in no acute distress; alert,appropriate and cooperative throughout examination HEENT: Normocephalic and atraumatic without obvious abnormalities.  Ears, externally no deformities Pulm: Breathing comfortably in no respiratory distress Range of motion at  the waist: Flexion, rotation and lateral bending: full rom  No echymosis or edema Rises to examination table with no difficulty Gait: minimally antalgic  Inspection/Deformity: No abnormality Paraspinus T:  Tender and tight, l3-s1, more on the L  B Ankle Dorsiflexion (L5,4): 5/5 B Great Toe Dorsiflexion (L5,4): 5/5 Heel Walk (L5): WNL Toe Walk (S1): WNL Rise/Squat (L4): WNL, mild pain  SENSORY B Medial Foot (L4): WNL B Dorsum (L5): WNL B Lateral (S1): WNL Light Touch:  WNL Pinprick: WNL  REFLEXES Knee (L4): 2+ Ankle (S1): 2+  B SLR, seated: neg B SLR, supine: neg B FABER: neg B Reverse FABER: neg B Greater Troch: NT B Log Roll: neg B Stork: NT B Sciatic Notch: NT  Radiology: No results found.  Assessment and Plan:   Acute left-sided low back pain without sciatica  Anatomy reviewed. Conservative algorithms for acute back pain generally begin with the following: NSAIDS, Muscle Relaxants,  Mild pain medication  Start with medications, core rehab, and progress from there following low back pain algorithm. No red flags are present.  Follow-up: No follow-ups on file.  Meds ordered this encounter  Medications  . promethazine (PHENERGAN) 25 MG tablet    Sig: Take 1 tablet (25 mg total) by mouth every 6 (six) hours as needed for nausea or vomiting.    Dispense:  30 tablet    Refill:  1  . tizanidine (ZANAFLEX) 2 MG capsule    Sig: Take 1 capsule (2 mg total) by mouth at bedtime.    Dispense:  30 capsule    Refill:  1   Signed,  Laityn Bensen T. Shelina Luo, MD   Allergies as of 12/12/2017      Reactions   Penicillins Nausea Only   Sulfonamide Derivatives Hives   Happened when she was a child.      Medication List        Accurate as of 12/12/17 11:59 PM. Always use your most recent med list.          cetirizine 10 MG tablet Commonly known as:  ZYRTEC Take 10 mg by mouth daily.   fluticasone 50 MCG/ACT nasal spray Commonly known as:  FLONASE Place 2 sprays into both nostrils daily.   ibuprofen 200 MG tablet Commonly known as:  ADVIL,MOTRIN Take 200 mg by mouth as needed.   multivitamin tablet Take 1 tablet by mouth daily. Reported on 10/06/2015   omeprazole 20 MG tablet Commonly known as:  PRILOSEC OTC Take 20 mg by mouth daily.   promethazine 25 MG tablet Commonly known as:  PHENERGAN Take 1 tablet (25 mg total) by mouth every 6 (six) hours as needed for nausea or vomiting.   tizanidine 2 MG capsule Commonly known as:   ZANAFLEX Take 1 capsule (2 mg total) by mouth at bedtime.

## 2017-12-19 ENCOUNTER — Ambulatory Visit: Payer: BLUE CROSS/BLUE SHIELD | Admitting: Family Medicine

## 2017-12-20 ENCOUNTER — Encounter: Payer: Self-pay | Admitting: Family Medicine

## 2017-12-20 ENCOUNTER — Ambulatory Visit: Payer: BLUE CROSS/BLUE SHIELD | Admitting: Family Medicine

## 2017-12-20 VITALS — BP 118/66 | HR 68 | Temp 98.6°F | Ht 59.5 in | Wt 132.0 lb

## 2017-12-20 DIAGNOSIS — B370 Candidal stomatitis: Secondary | ICD-10-CM | POA: Diagnosis not present

## 2017-12-20 MED ORDER — NYSTATIN 100000 UNIT/ML MT SUSP: 5.0000 mL | Freq: Three times a day (TID) | OROMUCOSAL | 0 refills | Status: DC

## 2017-12-20 NOTE — Patient Instructions (Signed)
Use the nystatin mouthwash - 1 teaspoon , swish for at least 30 seconds and swallow  Three times daily until better  Update if not starting to improve in a week or if worsening

## 2017-12-20 NOTE — Progress Notes (Signed)
Subjective:    Patient ID: Samantha Howell, female    DOB: 07-26-1966, 52 y.o.   MRN: [id number]  HPI  Here for ? Thrush   Started 4 days ago  Feels like something was in the back of throat  Tongue and frenulum are sore Gums are sore and the inside of lips  A little white coating   No fever or malaise   Some allergy symptoms   No abx /no steroids lately   Takes zyrtec   Patient Active Problem List   Diagnosis Date Noted  . Thrush 12/20/2017  . Headache syndrome 03/13/2017  . Routine general medical examination at a health care facility 05/23/2011  . Hyperlipemia 01/04/2009  . Seasonal allergic rhinitis due to pollen 12/16/2007  . GERD 12/16/2007  . FIBROCYSTIC BREAST DISEASE 12/16/2007   Past Medical History:  Diagnosis Date  . Abdominal pain   . Allergic rhinitis   . Allergy   . Anemia    history  . Fibrocystic breast   . GERD (gastroesophageal reflux disease)   . HLD (hyperlipidemia)    diet controlled, no meds  . Migraines   . Nausea   . SVD (spontaneous vaginal delivery)    x 2   Past Surgical History:  Procedure Laterality Date  . BREAST EXCISIONAL BIOPSY Left 2001   benign  . BREAST LUMPECTOMY  12/00   Left breast  . CHOLECYSTECTOMY  06/13/2011   Procedure: LAPAROSCOPIC CHOLECYSTECTOMY WITH INTRAOPERATIVE CHOLANGIOGRAM;  Surgeon: Adin Hector, MD;  Location: [facility];  Service: General;  Laterality: N/A;  . TUBAL LIGATION  1992  . WISDOM TOOTH EXTRACTION     Social History   Tobacco Use  . Smoking status: Former Smoker    Packs/day: 0.25    Years: 4.00    Pack years: 1.00    Types: Cigarettes    Last attempt to quit: 07/31/1996    Years since quitting: 21.4  . Smokeless tobacco: Never Used  Substance Use Topics  . Alcohol use: Yes    Alcohol/week: 0.0 oz    Comment: Occasional beer  . Drug use: No   Family History  Problem Relation Age of Onset  . Heart attack Father   . Hyperlipidemia Father   . Hearing loss Father   . Atrial  fibrillation Father   . Stroke Father   . Early menopause Mother   . Arthritis Mother   . Ovarian cancer Paternal Aunt   . Cancer Paternal Aunt        ovarian  . Cancer Maternal Grandmother        Bladder, breast  . Breast cancer Unknown        MGGM  . Crohn's disease Sister   . Ulcerative colitis Sister   . Colon cancer Neg Hx   . Esophageal cancer Neg Hx   . Rectal cancer Neg Hx   . Stomach cancer Neg Hx    Allergies  Allergen Reactions  . Penicillins Nausea Only  . Sulfonamide Derivatives Hives    Happened when she was a child.   Current Outpatient Medications on File Prior to Visit  Medication Sig Dispense Refill  . cetirizine (ZYRTEC) 10 MG tablet Take 10 mg by mouth daily.    . fluticasone (FLONASE) 50 MCG/ACT nasal spray Place 2 sprays into both nostrils daily. 16 g 1  . ibuprofen (ADVIL,MOTRIN) 200 MG tablet Take 200 mg by mouth as needed.    . Multiple Vitamin (MULTIVITAMIN) tablet Take 1 tablet  by mouth daily. Reported on 10/06/2015    . omeprazole (PRILOSEC OTC) 20 MG tablet Take 20 mg by mouth daily.    . promethazine (PHENERGAN) 25 MG tablet Take 1 tablet (25 mg total) by mouth every 6 (six) hours as needed for nausea or vomiting. 30 tablet 1  . tizanidine (ZANAFLEX) 2 MG capsule Take 1 capsule (2 mg total) by mouth at bedtime. 30 capsule 1   Current Facility-Administered Medications on File Prior to Visit  Medication Dose Route Frequency Provider Last Rate Last Dose  . 0.9 %  sodium chloride infusion  500 mL Intravenous Continuous Nandigam, Kavitha V, MD        Review of Systems  Constitutional: Negative for activity change, appetite change, fatigue, fever and unexpected weight change.  HENT: Negative for congestion, dental problem, facial swelling, mouth sores, postnasal drip, rhinorrhea, sinus pain, sore throat, trouble swallowing and voice change.        Mouth discomfort and altered taste  Eyes: Negative for pain, redness, itching and visual disturbance.    Respiratory: Negative for cough, chest tightness, shortness of breath and wheezing.   Cardiovascular: Negative for chest pain and palpitations.  Gastrointestinal: Negative for abdominal pain, blood in stool, constipation, diarrhea and nausea.  Endocrine: Negative for cold intolerance, heat intolerance, polydipsia and polyuria.  Genitourinary: Negative for difficulty urinating, dysuria, frequency and urgency.  Musculoskeletal: Negative for arthralgias, joint swelling and myalgias.  Skin: Negative for pallor and rash.  Neurological: Negative for dizziness, tremors, weakness, numbness and headaches.  Hematological: Negative for adenopathy. Does not bruise/bleed easily.  Psychiatric/Behavioral: Negative for decreased concentration and dysphoric mood. The patient is not nervous/anxious.        Objective:   Physical Exam  Constitutional: She appears well-developed and well-nourished. No distress.  Well appearing   HENT:  Head: Normocephalic and atraumatic.  Nose: Nose normal.  Slight white coating on tongue anteriorly  Mild injection of posterior throat  No swelling or exudate  Gums are slightly injected -no bleeding  No lesions or ulcers   Eyes: Pupils are equal, round, and reactive to light. Conjunctivae and EOM are normal. Right eye exhibits no discharge. Left eye exhibits no discharge. No scleral icterus.  Neck: Normal range of motion. Neck supple.  Cardiovascular: Normal rate, regular rhythm and normal heart sounds.  Pulmonary/Chest: Effort normal and breath sounds normal. She has no wheezes.  Lymphadenopathy:    She has no cervical adenopathy.  Skin: Skin is warm and dry. No rash noted. No erythema. No pallor.  Psychiatric: She has a normal mood and affect.  Pleasant           Assessment & Plan:   Problem List Items Addressed This Visit      Digestive   Thrush - Primary    With mouth/throat discomfort and white coating on tongue   tx with nystatin solution 1 tsp  swish and swallow tid until clear Update if not starting to improve in a week or if worsening        Relevant Medications   nystatin (MYCOSTATIN) 100000 UNIT/ML suspension

## 2017-12-20 NOTE — Assessment & Plan Note (Signed)
With mouth/throat discomfort and white coating on tongue   tx with nystatin solution 1 tsp swish and swallow tid until clear Update if not starting to improve in a week or if worsening

## 2017-12-28 ENCOUNTER — Telehealth: Payer: Self-pay

## 2017-12-28 MED ORDER — NYSTATIN 100000 UNIT/ML MT SUSP: 5.0000 mL | Freq: Three times a day (TID) | OROMUCOSAL | 0 refills | Status: DC

## 2017-12-28 NOTE — Telephone Encounter (Signed)
Copied from [place] [id number]. Topic: General - Other >> Dec 28, 2017  9:40 AM Yvette Rack wrote: Reason for CRM: pt calling stating that the oral thrush is still there and would like another RX for her thrush please call into the     [id number] - [place], [place] - [phone] (Phone) [phone] (Fax)

## 2017-12-28 NOTE — Telephone Encounter (Signed)
Pt was seen by Dr Glori Bickers on 12/20/17.Please advise.

## 2017-12-28 NOTE — Telephone Encounter (Signed)
I sent in another round  Continue it until better and keep [place] posted Thanks

## 2017-12-28 NOTE — Telephone Encounter (Signed)
Spoke to patient and was advised that the medication did help some and there was improvement,  but her problem never completely went away. Patients stated that she also started taking a probiotics 3 days ago hoping that will help also.

## 2017-12-28 NOTE — Telephone Encounter (Signed)
Left vm for the pt to call back.  

## 2017-12-28 NOTE — Telephone Encounter (Signed)
Any improvement at all ?  Please let me know before I refill  Thanks

## 2017-12-28 NOTE — Telephone Encounter (Signed)
Pt is aware.  

## 2018-03-19 ENCOUNTER — Ambulatory Visit (INDEPENDENT_AMBULATORY_CARE_PROVIDER_SITE_OTHER): Payer: BLUE CROSS/BLUE SHIELD | Admitting: Internal Medicine

## 2018-03-19 ENCOUNTER — Encounter: Payer: Self-pay | Admitting: Internal Medicine

## 2018-03-19 VITALS — BP 110/70 | HR 60 | Temp 98.2°F | Ht 59.0 in | Wt 127.0 lb

## 2018-03-19 DIAGNOSIS — K219 Gastro-esophageal reflux disease without esophagitis: Secondary | ICD-10-CM

## 2018-03-19 DIAGNOSIS — Z0000 Encounter for general adult medical examination without abnormal findings: Secondary | ICD-10-CM | POA: Diagnosis not present

## 2018-03-19 DIAGNOSIS — E785 Hyperlipidemia, unspecified: Secondary | ICD-10-CM

## 2018-03-19 NOTE — Assessment & Plan Note (Signed)
Mild symptoms still Discussed dosing

## 2018-03-19 NOTE — Progress Notes (Signed)
Subjective:    Patient ID: Samantha Howell, female    DOB: 1966/06/01, 52 y.o.   MRN: [id number]  HPI Here for physical  Discussed the thrush Tongue still doesn't feel right--like it was scalded Taste buds still don't feel right Discussed trying multivitamin in case vitamin related  Has changed diet---eating more healthy Has lost some weight---but doesn't feel better No regular exercise  Having back problems Worried about FH osteoporosis Dr Carloyn Manner who has operated on her mother---told her she has some scoliosis already Does have exercises she got from Dr Lorelei Pont  Current Outpatient Medications on File Prior to Visit  Medication Sig Dispense Refill  . cetirizine (ZYRTEC) 10 MG tablet Take 10 mg by mouth daily.    . fluticasone (FLONASE) 50 MCG/ACT nasal spray Place 2 sprays into both nostrils daily. 16 g 1  . ibuprofen (ADVIL,MOTRIN) 200 MG tablet Take 200 mg by mouth as needed.    Marland Kitchen omeprazole (PRILOSEC OTC) 20 MG tablet Take 20 mg by mouth daily.    . promethazine (PHENERGAN) 25 MG tablet Take 1 tablet (25 mg total) by mouth every 6 (six) hours as needed for nausea or vomiting. 30 tablet 1  . tizanidine (ZANAFLEX) 2 MG capsule Take 1 capsule (2 mg total) by mouth at bedtime. 30 capsule 1   No current facility-administered medications on file prior to visit.     Allergies  Allergen Reactions  . Penicillins Nausea Only  . Sulfonamide Derivatives Hives    Happened when she was a child.    Past Medical History:  Diagnosis Date  . Abdominal pain   . Allergic rhinitis   . Allergy   . Anemia    history  . Fibrocystic breast   . GERD (gastroesophageal reflux disease)   . HLD (hyperlipidemia)    diet controlled, no meds  . Migraines   . Nausea   . SVD (spontaneous vaginal delivery)    x 2    Past Surgical History:  Procedure Laterality Date  . BREAST EXCISIONAL BIOPSY Left 2001   benign  . BREAST LUMPECTOMY  12/00   Left breast  . CHOLECYSTECTOMY  06/13/2011   Procedure: LAPAROSCOPIC CHOLECYSTECTOMY WITH INTRAOPERATIVE CHOLANGIOGRAM;  Surgeon: Adin Hector, MD;  Location: [facility];  Service: General;  Laterality: N/A;  . TUBAL LIGATION  1992  . WISDOM TOOTH EXTRACTION      Family History  Problem Relation Age of Onset  . Heart attack Father   . Hyperlipidemia Father   . Hearing loss Father   . Atrial fibrillation Father   . Stroke Father   . Early menopause Mother   . Arthritis Mother   . Ovarian cancer Paternal Aunt   . Cancer Paternal Aunt        ovarian  . Cancer Maternal Grandmother        Bladder, breast  . Breast cancer Unknown        MGGM  . Crohn's disease Sister   . Ulcerative colitis Sister   . Colon cancer Neg Hx   . Esophageal cancer Neg Hx   . Rectal cancer Neg Hx   . Stomach cancer Neg Hx     Social History   Socioeconomic History  . Marital status: Married    Spouse name: Not on file  . Number of children: 2  . Years of education: Not on file  . Highest education level: Not on file  Occupational History  . Occupation: Building control surveyor: [employer]  Social Needs  . Financial resource strain: Not on file  . Food insecurity:    Worry: Not on file    Inability: Not on file  . Transportation needs:    Medical: Not on file    Non-medical: Not on file  Tobacco Use  . Smoking status: Former Smoker    Packs/day: 0.25    Years: 4.00    Pack years: 1.00    Types: Cigarettes    Last attempt to quit: 07/31/1996    Years since quitting: 21.6  . Smokeless tobacco: Never Used  Substance and Sexual Activity  . Alcohol use: Yes    Alcohol/week: 0.0 standard drinks    Comment: Occasional beer  . Drug use: No  . Sexual activity: Not on file  Lifestyle  . Physical activity:    Days per week: Not on file    Minutes per session: Not on file  . Stress: Not on file  Relationships  . Social connections:    Talks on phone: Not on file    Gets together: Not on file    Attends religious service: Not on file    Active  member of club or organization: Not on file    Attends meetings of clubs or organizations: Not on file    Relationship status: Not on file  . Intimate partner violence:    Fear of current or ex partner: Not on file    Emotionally abused: Not on file    Physically abused: Not on file    Forced sexual activity: Not on file  Other Topics Concern  . 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       Wears seat belt  HENT: Negative for dental problem, tinnitus and trouble swallowing.        Mild hearing loss Keeps up with dentist  Eyes: Negative for visual disturbance.       No diplopia or unilateral vision loss  Respiratory: Negative for cough, chest tightness and shortness of breath.   Cardiovascular: Negative for chest pain, palpitations and leg swelling.  Gastrointestinal: Negative for blood in stool and constipation.       Some heartburn despite the prilosec OTC  Endocrine: Negative for polydipsia and polyuria.  Genitourinary: Negative for dyspareunia, dysuria and hematuria.  Musculoskeletal: Positive for back pain.       Some joint stiffness---hands, knees, feet  Skin:       Skin is dry Itching No suspicious lesions  Allergic/Immunologic: Positive for environmental allergies. Negative for immunocompromised state.       Zyrtec helps  Neurological: Negative for dizziness, syncope and light-headedness.       Headaches are declining--didn't like the headache specialist. Advil/antiemetic really helps  Psychiatric/Behavioral: Negative for dysphoric mood and sleep disturbance. The patient is not nervous/anxious.        Objective:   Physical Exam  Constitutional: She is oriented to person, place, and time. She appears well-developed. No distress.  HENT:  Head: Normocephalic and atraumatic.  Right Ear: External ear normal.  Left Ear: External ear normal.    Mouth/Throat: Oropharynx is clear and moist. No oropharyngeal exudate.  Eyes: Pupils are equal, round, and reactive to light. Conjunctivae are normal.  Neck: No thyromegaly present.  Cardiovascular: Normal rate, regular rhythm, normal heart sounds and intact distal pulses. Exam  reveals no gallop.  No murmur heard. Respiratory: Effort normal and breath sounds normal. No respiratory distress. She has no wheezes. She has no rales.  GI: Soft. There is no tenderness.  Musculoskeletal: She exhibits no edema or tenderness.  Lymphadenopathy:    She has no cervical adenopathy.  Neurological: She is alert and oriented to person, place, and time.  Skin: No rash noted. No erythema.  Psychiatric: She has a normal mood and affect. Her behavior is normal.           Assessment & Plan:

## 2018-03-19 NOTE — Assessment & Plan Note (Signed)
Healthy Many aging concerns---discussed trying MVI Colon and pap due 2022 Yearly mammo due now Yearly flu vaccine recommended Discussed fitness

## 2018-03-19 NOTE — Assessment & Plan Note (Signed)
Will recheck  No meds for now 

## 2018-03-20 LAB — SPECIMEN STATUS REPORT

## 2018-03-22 LAB — COMPREHENSIVE METABOLIC PANEL
A/G RATIO: 2 (ref 1.2–2.2)
ALT: 15 IU/L (ref 0–32)
AST: 15 IU/L (ref 0–40)
Albumin: 4.8 g/dL (ref 3.5–5.5)
Alkaline Phosphatase: 67 IU/L (ref 39–117)
BUN/Creatinine Ratio: 16 (ref 9–23)
BUN: 13 mg/dL (ref 6–24)
CALCIUM: 9.9 mg/dL (ref 8.7–10.2)
CHLORIDE: 102 mmol/L (ref 96–106)
CO2: 22 mmol/L (ref 20–29)
Creatinine, Ser: 0.79 mg/dL (ref 0.57–1.00)
GFR calc Af Amer: 99 mL/min/{1.73_m2} (ref >59)
GFR, EST NON AFRICAN AMERICAN: 86 mL/min/{1.73_m2} (ref >59)
GLOBULIN, TOTAL: 2.4 g/dL (ref 1.5–4.5)
Glucose: 95 mg/dL (ref 65–99)
POTASSIUM: 4.1 mmol/L (ref 3.5–5.2)
SODIUM: 140 mmol/L (ref 134–144)
Total Protein: 7.2 g/dL (ref 6.0–8.5)

## 2018-03-22 LAB — CBC
HEMATOCRIT: 44.1 % (ref 34.0–46.6)
Hemoglobin: 14.4 g/dL (ref 11.1–15.9)
MCH: 28.2 pg (ref 26.6–33.0)
MCHC: 32.7 g/dL (ref 31.5–35.7)
MCV: 87 fL (ref 79–97)
Platelets: 232 10*3/uL (ref 150–450)
RBC: 5.1 x10E6/uL (ref 3.77–5.28)
RDW: 13.6 % (ref 12.3–15.4)
WBC: 5.2 10*3/uL (ref 3.4–10.8)

## 2018-03-22 LAB — T4, FREE: Free T4: 1.16 ng/dL (ref 0.82–1.77)

## 2018-03-22 LAB — LIPID PANEL
CHOLESTEROL TOTAL: 239 mg/dL — AB (ref 100–199)
Chol/HDL Ratio: 4 ratio (ref 0.0–4.4)
HDL: 60 mg/dL (ref >39)
LDL CALC: 143 mg/dL — AB (ref 0–99)
Triglycerides: 179 mg/dL — ABNORMAL HIGH (ref 0–149)
VLDL Cholesterol Cal: 36 mg/dL (ref 5–40)

## 2018-03-22 LAB — VITAMIN D 25 HYDROXY (VIT D DEFICIENCY, FRACTURES): Vit D, 25-Hydroxy: 50.8 ng/mL (ref 30.0–100.0)

## 2018-03-22 LAB — VITAMIN B12: VITAMIN B 12: 750 pg/mL (ref 232–1245)

## 2018-04-03 ENCOUNTER — Ambulatory Visit: Payer: Self-pay | Admitting: *Deleted

## 2018-04-03 NOTE — Telephone Encounter (Signed)
   Reason for Disposition . Normal local reaction to bee, wasp, or yellow jacket sting  Answer Assessment - Initial Assessment Questions 1. TYPE: "What type of sting was it?" (bee, yellow jacket, etc.)      Paper wasp stung on left foot posterior near heel 2. ONSET: "When did it occur?"      Monday 3. LOCATION: "Where is the sting located?"  "How many stings?"     Left foot ankle 4. SWELLING SIZE: "How big is the swelling?" (inches or centimeters)    Most of foot 5. REDNESS: "Is the area red or pink?" If so, ask "What size is area of redness?" (inches or cm). "When did the redness start?"    Monday 6. PAIN: "Is there any pain?" If so, ask: "How bad is it?"  (Scale 1-10; or mild, moderate, severe)     no 7. ITCHING: "Is there any itching?" If so, ask: "How bad is it?"      mild 8. RESPIRATORY DISTRESS: "Describe your breathing."     no 9. PRIOR REACTIONS: "Have you had any severe allergic reactions to stings in the past?" if yes, ask: "What happened?"     no 10. OTHER SYMPTOMS: "Do you have any other symptoms?" (e.g., face or tongue swelling, new rash elsewhere, abdominal pain, vomiting)       no 11. PREGNANCY: "Is there any chance you are pregnant?" "When was your last menstrual period?"      no  Protocols used: BEE OR YELLOW JACKET STING-A-AH

## 2018-04-09 ENCOUNTER — Other Ambulatory Visit: Payer: Self-pay | Admitting: Internal Medicine

## 2018-04-09 DIAGNOSIS — Z1231 Encounter for screening mammogram for malignant neoplasm of breast: Secondary | ICD-10-CM

## 2018-05-09 ENCOUNTER — Ambulatory Visit
Admission: RE | Admit: 2018-05-09 | Discharge: 2018-05-09 | Disposition: A | Discharge status: Home / Self Care | Payer: BLUE CROSS/BLUE SHIELD | Source: Ambulatory Visit | Attending: Internal Medicine | Admitting: Internal Medicine

## 2018-05-09 DIAGNOSIS — Z1231 Encounter for screening mammogram for malignant neoplasm of breast: Secondary | ICD-10-CM | POA: Diagnosis not present

## 2018-08-03 DIAGNOSIS — J019 Acute sinusitis, unspecified: Secondary | ICD-10-CM | POA: Diagnosis not present

## 2018-08-05 ENCOUNTER — Telehealth: Payer: Self-pay

## 2018-08-05 NOTE — Telephone Encounter (Signed)
[facility] faxed note on 08/03/18;I spoke with pt and she was seen at [facility] and dx with sinus infection; pt was given Augmentin and mucinex DM. Pt is feeling much better, voice is back and congestion is better. Pt will cb if needed. FYI to Dr Silvio Pate.

## 2018-08-05 NOTE — Telephone Encounter (Signed)
Glad to hear she is doing better

## 2018-09-19 ENCOUNTER — Telehealth: Payer: Self-pay | Admitting: Internal Medicine

## 2018-09-19 MED ORDER — CIPROFLOXACIN HCL 250 MG PO TABS: 250.0000 mg | ORAL_TABLET | Freq: Two times a day (BID) | ORAL | 0 refills | Status: DC

## 2018-09-19 NOTE — Telephone Encounter (Signed)
Spoke to pt and advised her she would need an OV. She said Dr Silvio Pate usually keeps her supplied with a Rx of Cipro for recurrent UTIs. She does not have a Rx right now. She stated he asked her at her CPE in August if she needed a Rx and she declined at the time. I advised her Dr Silvio Pate is out of the office until after lunch. I would get back with her.

## 2018-09-19 NOTE — Telephone Encounter (Signed)
Okay to send cipro #6 x 0 for her to use for her typical cystitis symptoms

## 2018-09-19 NOTE — Telephone Encounter (Signed)
Pt think she has a urinary infection and want to know if she can come in and do a lab test and be prescribe some medication. Please advise.

## 2018-09-19 NOTE — Telephone Encounter (Signed)
Spoke to pt. Rx sent electronically.

## 2018-11-27 ENCOUNTER — Other Ambulatory Visit: Payer: Self-pay | Admitting: Family Medicine

## 2018-11-27 NOTE — Telephone Encounter (Signed)
Last office visit 03/19/2018 for CPE.  Last refilled 12/12/2017 for #30 with 1 refill.  CPE scheduled for 03/26/2019.

## 2019-02-18 ENCOUNTER — Encounter: Payer: Self-pay | Admitting: Internal Medicine

## 2019-02-18 ENCOUNTER — Other Ambulatory Visit: Payer: Self-pay

## 2019-02-18 ENCOUNTER — Ambulatory Visit (INDEPENDENT_AMBULATORY_CARE_PROVIDER_SITE_OTHER): Payer: BC Managed Care – PPO | Admitting: Internal Medicine

## 2019-02-18 DIAGNOSIS — H5711 Ocular pain, right eye: Secondary | ICD-10-CM | POA: Diagnosis not present

## 2019-02-18 MED ORDER — VALACYCLOVIR HCL 1 G PO TABS: 1000.0000 mg | ORAL_TABLET | Freq: Three times a day (TID) | ORAL | 0 refills | Status: DC

## 2019-02-18 NOTE — Assessment & Plan Note (Signed)
Pattern is consistent with recurrence of zoster--though no visible lesions She will see eye doctor for any change in vision or any breakout of vesicles Will treat with valacyclovir--just in case

## 2019-02-18 NOTE — Progress Notes (Signed)
Subjective:    Patient ID: Samantha Howell, female    DOB: 16-Dec-1965, 53 y.o.   MRN: [id number]  HPI Here due to eye pain  4 days ago--after work (at home), she blinked and it felt like her eyelid was sore---right Didn't see anything obvious Did have some tenderness if she pressed on the inside of her eye (near nose) Had some soreness over lateral upper orbit area  Some ear pain on right--2 days ago Had some fullness in her head on the right--where she had shingles some years ago Slight soreness--like inside behind her eye  Awoke this morning with frontal headache Not really like a typical migraine Now just very slight  Did try sudafed and 2 advil 2 and 3 days ago--not clearly helpful  Current Outpatient Medications on File Prior to Visit  Medication Sig Dispense Refill  . cetirizine (ZYRTEC) 10 MG tablet Take 10 mg by mouth daily.    . fluticasone (FLONASE) 50 MCG/ACT nasal spray Place 2 sprays into both nostrils daily. 16 g 1  . ibuprofen (ADVIL,MOTRIN) 200 MG tablet Take 200 mg by mouth as needed.    . Multiple Vitamin (MULTIVITAMIN) tablet Take 1 tablet by mouth daily.    Marland Kitchen omeprazole (PRILOSEC OTC) 20 MG tablet Take 20 mg by mouth daily.    . promethazine (PHENERGAN) 25 MG tablet Take 1 tablet (25 mg total) by mouth every 6 (six) hours as needed for nausea or vomiting. 30 tablet 1  . tizanidine (ZANAFLEX) 2 MG capsule TAKE 1 CAPSULE (2 MG TOTAL) BY MOUTH AT BEDTIME. 30 capsule 1   No current facility-administered medications on file prior to visit.     Allergies  Allergen Reactions  . Penicillins Nausea Only  . Sulfonamide Derivatives Hives    Happened when she was a child.    Past Medical History:  Diagnosis Date  . Abdominal pain   . Allergic rhinitis   . Allergy   . Anemia    history  . Fibrocystic breast   . GERD (gastroesophageal reflux disease)   . HLD (hyperlipidemia)    diet controlled, no meds  . Migraines   . Nausea   . SVD (spontaneous vaginal  delivery)    x 2    Past Surgical History:  Procedure Laterality Date  . BREAST EXCISIONAL BIOPSY Left 2001   benign  . BREAST LUMPECTOMY  12/00   Left breast  . CHOLECYSTECTOMY  06/13/2011   Procedure: LAPAROSCOPIC CHOLECYSTECTOMY WITH INTRAOPERATIVE CHOLANGIOGRAM;  Surgeon: Adin Hector, MD;  Location: [facility];  Service: General;  Laterality: N/A;  . TUBAL LIGATION  1992  . WISDOM TOOTH EXTRACTION      Family History  Problem Relation Age of Onset  . Heart attack Father   . Hyperlipidemia Father   . Hearing loss Father   . Atrial fibrillation Father   . Stroke Father   . Early menopause Mother   . Arthritis Mother   . Ovarian cancer Paternal Aunt   . Cancer Paternal Aunt        ovarian  . Cancer Maternal Grandmother        Bladder, breast  . Breast cancer Unknown        MGGM  . Crohn's disease Sister   . Ulcerative colitis Sister   . Colon cancer Neg Hx   . Esophageal cancer Neg Hx   . Rectal cancer Neg Hx   . Stomach cancer Neg Hx     Social History  Socioeconomic History  . Marital status: Married    Spouse name: Not on file  . Number of children: 2  . Years of education: Not on file  . Highest education level: Not on file  Occupational History  . 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 Financial resource strain: Not on file  . Food insecurity    Worry: Not on file    Inability: Not on file  . Transportation needs    Medical: Not on file    Non-medical: Not on file  Tobacco Use  . Smoking status: Former Smoker    Packs/day: 0.25    Years: 4.00    Pack years: 1.00    Types: Cigarettes    Quit date: 07/31/1996    Years since quitting: 22.5  . Smokeless tobacco: Never Used  Substance and Sexual Activity  . Alcohol use: Yes    Alcohol/week: 0.0 standard drinks    Comment: Occasional beer  . Drug use: No  . Sexual activity: Not on file  Lifestyle  . Physical activity    Days per week: Not on file    Minutes per session: Not  on file  . Stress: Not on file  Relationships  . Social Herbalist on phone: Not on file    Gets together: Not on file    Attends religious service: Not on file    Active member of club or organization: Not on file    Attends meetings of clubs or organizations: Not on file    Relationship status: Not on file  . Intimate partner violence    Fear of current or ex partner: Not on file    Emotionally abused: Not on file    Physically abused: Not on file    Forced sexual activity: Not on file  Other Topics Concern  . 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 No distress.  HENT:  No ear or oral lesions No scalp lesions  Eyes: Pupils are equal, round, and reactive to light. Conjunctivae and EOM are normal.  No orbital tenderness           Assessment & Plan:

## 2019-03-26 ENCOUNTER — Ambulatory Visit (INDEPENDENT_AMBULATORY_CARE_PROVIDER_SITE_OTHER): Payer: BC Managed Care – PPO | Admitting: Internal Medicine

## 2019-03-26 ENCOUNTER — Encounter: Payer: Self-pay | Admitting: Internal Medicine

## 2019-03-26 ENCOUNTER — Other Ambulatory Visit: Payer: Self-pay

## 2019-03-26 VITALS — BP 120/78 | HR 70 | Temp 98.3°F | Ht 59.5 in | Wt 134.0 lb

## 2019-03-26 DIAGNOSIS — E785 Hyperlipidemia, unspecified: Secondary | ICD-10-CM | POA: Diagnosis not present

## 2019-03-26 DIAGNOSIS — K2100 Gastro-esophageal reflux disease with esophagitis, without bleeding: Secondary | ICD-10-CM

## 2019-03-26 DIAGNOSIS — G4489 Other headache syndrome: Secondary | ICD-10-CM | POA: Diagnosis not present

## 2019-03-26 DIAGNOSIS — Z0000 Encounter for general adult medical examination without abnormal findings: Secondary | ICD-10-CM

## 2019-03-26 LAB — COMPREHENSIVE METABOLIC PANEL
ALT: 17 U/L (ref 0–35)
AST: 16 U/L (ref 0–37)
Albumin: 4.6 g/dL (ref 3.5–5.2)
Alkaline Phosphatase: 66 U/L (ref 39–117)
BUN: 11 mg/dL (ref 6–23)
CO2: 29 mEq/L (ref 19–32)
Calcium: 9.7 mg/dL (ref 8.4–10.5)
Chloride: 105 mEq/L (ref 96–112)
Creatinine, Ser: 0.79 mg/dL (ref 0.40–1.20)
GFR: 75.82 mL/min (ref >60.00)
Glucose, Bld: 93 mg/dL (ref 70–99)
Potassium: 4.2 mEq/L (ref 3.5–5.1)
Sodium: 142 mEq/L (ref 135–145)
Total Bilirubin: 0.3 mg/dL (ref 0.2–1.2)
Total Protein: 6.6 g/dL (ref 6.0–8.3)

## 2019-03-26 LAB — LDL CHOLESTEROL, DIRECT: Direct LDL: 160 mg/dL

## 2019-03-26 LAB — CBC
HCT: 41.7 % (ref 36.0–46.0)
Hemoglobin: 13.9 g/dL (ref 12.0–15.0)
MCHC: 33.3 g/dL (ref 30.0–36.0)
MCV: 87.1 fl (ref 78.0–100.0)
Platelets: 243 10*3/uL (ref 150.0–400.0)
RBC: 4.79 Mil/uL (ref 3.87–5.11)
RDW: 14.2 % (ref 11.5–15.5)
WBC: 4.6 10*3/uL (ref 4.0–10.5)

## 2019-03-26 LAB — LIPID PANEL
Cholesterol: 265 mg/dL — ABNORMAL HIGH (ref 0–200)
HDL: 50.9 mg/dL (ref >39.00)
NonHDL: 214.52
Total CHOL/HDL Ratio: 5
Triglycerides: 247 mg/dL — ABNORMAL HIGH (ref 0.0–149.0)
VLDL: 49.4 mg/dL — ABNORMAL HIGH (ref 0.0–40.0)

## 2019-03-26 MED ORDER — SUMATRIPTAN SUCCINATE 50 MG PO TABS: 50.0000 mg | ORAL_TABLET | Freq: Once | ORAL | 3 refills | Status: DC

## 2019-03-26 NOTE — Assessment & Plan Note (Signed)
Does okay with PPI

## 2019-03-26 NOTE — Assessment & Plan Note (Signed)
Discussed primary prevention Will continue to hold off

## 2019-03-26 NOTE — Assessment & Plan Note (Signed)
Does seem migrainous Will try sumatriptan to see if that works better than phenergan/ibuprofen

## 2019-03-26 NOTE — Assessment & Plan Note (Signed)
Healthy Will work on fitness Flu vaccine later in fall Pap and colon in 2022 mammo due in November

## 2019-03-26 NOTE — Progress Notes (Addendum)
Subjective:    Patient ID: Samantha Howell, female    DOB: 1965-10-31, 53 y.o.   MRN: [id number]  HPI Here for physical Working from home---- doing okay Not doing so great with healthy behaviors--- staying at home is tough Trying to walk 2 miles a day Weight up 7#  Still with some headaches Overall getting better Head "feels weird" at times--mostly on right side Can get some vague visual aura or feel it in the back of her head and then it moves around Not as sensitive to smell--but does get nausea, etc Uses phenergan and 2-3 advil----helps  Current Outpatient Medications on File Prior to Visit  Medication Sig Dispense Refill  . cetirizine (ZYRTEC) 10 MG tablet Take 10 mg by mouth daily.    . fluticasone (FLONASE) 50 MCG/ACT nasal spray Place 2 sprays into both nostrils daily. 16 g 1  . ibuprofen (ADVIL,MOTRIN) 200 MG tablet Take 200 mg by mouth as needed.    . Multiple Vitamin (MULTIVITAMIN) tablet Take 1 tablet by mouth daily.    Marland Kitchen omeprazole (PRILOSEC OTC) 20 MG tablet Take 20 mg by mouth daily.    . promethazine (PHENERGAN) 25 MG tablet Take 1 tablet (25 mg total) by mouth every 6 (six) hours as needed for nausea or vomiting. 30 tablet 1  . tizanidine (ZANAFLEX) 2 MG capsule TAKE 1 CAPSULE (2 MG TOTAL) BY MOUTH AT BEDTIME. 30 capsule 1   No current facility-administered medications on file prior to visit.     Allergies  Allergen Reactions  . Penicillins Nausea Only  . Sulfonamide Derivatives Hives    Happened when she was a child.    Past Medical History:  Diagnosis Date  . Abdominal pain   . Allergic rhinitis   . Allergy   . Anemia    history  . Fibrocystic breast   . GERD (gastroesophageal reflux disease)   . HLD (hyperlipidemia)    diet controlled, no meds  . Migraines   . Nausea   . SVD (spontaneous vaginal delivery)    x 2    Past Surgical History:  Procedure Laterality Date  . BREAST EXCISIONAL BIOPSY Left 2001   benign  . BREAST LUMPECTOMY  12/00    Left breast  . CHOLECYSTECTOMY  06/13/2011   Procedure: LAPAROSCOPIC CHOLECYSTECTOMY WITH INTRAOPERATIVE CHOLANGIOGRAM;  Surgeon: Adin Hector, MD;  Location: [facility];  Service: General;  Laterality: N/A;  . TUBAL LIGATION  1992  . WISDOM TOOTH EXTRACTION      Family History  Problem Relation Age of Onset  . Heart attack Father   . Hyperlipidemia Father   . Hearing loss Father   . Atrial fibrillation Father   . Stroke Father   . Early menopause Mother   . Arthritis Mother   . Ovarian cancer Paternal Aunt   . Cancer Paternal Aunt        ovarian  . Cancer Maternal Grandmother        Bladder, breast  . Breast cancer Unknown        MGGM  . Crohn's disease Sister   . Ulcerative colitis Sister   . Colon cancer Neg Hx   . Esophageal cancer Neg Hx   . Rectal cancer Neg Hx   . Stomach cancer Neg Hx     Social History   Socioeconomic History  . Marital status: Married    Spouse name: Not on file  . Number of children: 2  . Years of education: Not on  file  . Highest education level: Not on file  Occupational History  . 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 Financial resource strain: Not on file  . Food insecurity    Worry: Not on file    Inability: Not on file  . Transportation needs    Medical: Not on file    Non-medical: Not on file  Tobacco Use  . Smoking status: Former Smoker    Packs/day: 0.25    Years: 4.00    Pack years: 1.00    Types: Cigarettes    Quit date: 07/31/1996    Years since quitting: 22.6  . Smokeless tobacco: Never Used  Substance and Sexual Activity  . Alcohol use: Yes    Alcohol/week: 0.0 standard drinks    Comment: Occasional beer  . Drug use: No  . Sexual activity: Not on file  Lifestyle  . Physical activity    Days per week: Not on file    Minutes per session: Not on file  . Stress: Not on file  Relationships  . Social Herbalist on phone: Not on file    Gets together: Not on file    Attends religious  service: Not on file    Active member of club or organization: Not on file    Attends meetings of clubs or organizations: Not on file    Relationship status: Not on file  . Intimate partner violence    Fear of current or ex partner: Not on file    Emotionally abused: Not on file    Physically abused: Not on file    Forced sexual activity: Not on file  Other Topics Concern  . 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       Wears seat belt  HENT: Negative for hearing loss, tinnitus and trouble swallowing.        Tongue still feels weird--no thrush Keeps up with dentist  Eyes:       No diplopia or unilateral vision loss  Respiratory: Negative for cough, chest tightness and shortness of breath.   Cardiovascular: Negative for chest pain and palpitations.       Slight edema at end of day--resolves quickly  Gastrointestinal: Negative for blood in stool and constipation.       Occasional heartburn--doubles the PPI rarely  Endocrine: Negative for polydipsia and polyuria.  Genitourinary: Negative for difficulty urinating, dyspareunia and dysuria.  Musculoskeletal: Positive for arthralgias and back pain. Negative for joint swelling.       Stiffness and pain in joints--mild. No meds. Better if she keeps moving Heating pad for back  Skin: Negative for rash.       Has persistent spot on leg--to be checked. No change  Allergic/Immunologic: Positive for environmental allergies. Negative for immunocompromised state.       Seasonal ----zyrtec helps  Neurological: Positive for headaches. Negative for dizziness, syncope and light-headedness.  Hematological: Negative for adenopathy. Does not bruise/bleed easily.  Psychiatric/Behavioral: Negative for dysphoric mood. The patient is not nervous/anxious.        Chronic sleep issues--- 5 hours per night. Occasional daytime  somnolence. No apparent apnea--mild snoring       Objective:   Physical Exam  Constitutional: She is oriented to person, place, and time. She appears well-developed.  No distress.  HENT:  Head: Normocephalic and atraumatic.  Right Ear: External ear normal.  Left Ear: External ear normal.  Mouth/Throat: Oropharynx is clear and moist. No oropharyngeal exudate.  Eyes: Pupils are equal, round, and reactive to light. Conjunctivae are normal.  Neck: No thyromegaly present.  Cardiovascular: Normal rate, regular rhythm, normal heart sounds and intact distal pulses. Exam reveals no gallop.  No murmur heard. Respiratory: Effort normal and breath sounds normal. No respiratory distress. She has no wheezes. She has no rales.  GI: Soft. There is no abdominal tenderness.  Musculoskeletal:        General: No tenderness or edema.  Lymphadenopathy:    She has no cervical adenopathy.  Neurological: She is alert and oriented to person, place, and time.  Skin: No rash noted. No erythema.  Small apparently benign lesion upper left thigh---recommended routine derm check up  Psychiatric: She has a normal mood and affect. Her behavior is normal.           Assessment & Plan:

## 2019-04-14 ENCOUNTER — Other Ambulatory Visit: Payer: Self-pay | Admitting: Internal Medicine

## 2019-04-14 DIAGNOSIS — Z1231 Encounter for screening mammogram for malignant neoplasm of breast: Secondary | ICD-10-CM

## 2019-05-12 ENCOUNTER — Other Ambulatory Visit: Payer: Self-pay

## 2019-05-12 ENCOUNTER — Ambulatory Visit
Admission: RE | Admit: 2019-05-12 | Discharge: 2019-05-12 | Disposition: A | Discharge status: Home / Self Care | Payer: BC Managed Care – PPO | Source: Ambulatory Visit | Attending: Internal Medicine | Admitting: Internal Medicine

## 2019-05-12 DIAGNOSIS — Z1231 Encounter for screening mammogram for malignant neoplasm of breast: Secondary | ICD-10-CM | POA: Diagnosis not present

## 2019-10-15 ENCOUNTER — Other Ambulatory Visit: Payer: Self-pay

## 2019-10-15 ENCOUNTER — Encounter: Payer: Self-pay | Admitting: Family Medicine

## 2019-10-15 ENCOUNTER — Ambulatory Visit: Payer: BC Managed Care – PPO | Admitting: Family Medicine

## 2019-10-15 VITALS — BP 106/70 | HR 66 | Temp 98.1°F | Ht 59.5 in | Wt 140.5 lb

## 2019-10-15 DIAGNOSIS — M791 Myalgia, unspecified site: Secondary | ICD-10-CM

## 2019-10-15 DIAGNOSIS — M722 Plantar fascial fibromatosis: Secondary | ICD-10-CM

## 2019-10-15 DIAGNOSIS — M2550 Pain in unspecified joint: Secondary | ICD-10-CM | POA: Diagnosis not present

## 2019-10-15 DIAGNOSIS — Z8379 Family history of other diseases of the digestive system: Secondary | ICD-10-CM

## 2019-10-15 NOTE — Progress Notes (Signed)
Samantha Yontz T. Stevie Ertle, MD [facility] and [place] at [facility] [place], [address] Phone: [phone]  FAX: [phone]  ANTHA DOSIER - 54 y.o. female  MRN [id number]  Date of Birth: [birth year]  Visit Date: 10/15/2019  PCP: Venia Carbon, MD  Referred by: Venia Carbon, MD  Chief Complaint  Patient presents with  . Hip Pain    Left  . Knot on bottom of Feet  . Knee Pain    This visit occurred during the SARS-CoV-2 public health emergency.  Safety protocols were in place, including screening questions prior to the visit, additional usage of staff PPE, and extensive cleaning of exam room while observing appropriate contact time as indicated for disinfecting solutions.   Subjective:   Samantha Howell is a 54 y.o. very pleasant female patient with Body mass index is 27.9 kg/m. who presents with the following:  Multiple bone and joint pain:  In the last couple of months, back and hips, feet and legs.  Knees will ache some going up and down stairs.   She essentially is having pain in most joints of her body.  She complains of hip pain, knee pain, pain in her back, neck, the as well as in her legs.  This is escalated in the last 2 months.  She has not any prior trauma or injury.  She does have some bogginess and effusion on multiple joints.  Several plantar fibromas  Shoulder occ are botherine her.  Will do some str training Not much activity at all.  Trouble walking.   Intermittently will have some global symptoms.  Sister with UC and nephew with Crohn's disease  Last couple of months has gotten a lot worse.    Review of Systems is noted in the HPI, as appropriate   Objective:   BP 106/70   Pulse 66   Temp 98.1 F (36.7 C) (Temporal)   Ht 4' 11.5" (1.511 m)   Wt 140 lb 8 oz (63.7 kg)   LMP 03/02/2013   SpO2 98%   BMI 27.90 kg/m   GEN: No acute distress; alert,appropriate. PULM:  Breathing comfortably in no respiratory distress PSYCH: Normally interactive.   Pain with motion with mild loss of motion throughout the cervical spine.  Full range of motion at the shoulder with some bilateral impingement signs.  Strength is 5/5 and neurovascularly intact.  Patient also has some bogginess and tenderness at the true wrist bilaterally as well as the MCPs and PIP joints.  Excellent range of motion at the hips with no pain with but deer.  Range of motion is full.  Strength is 5/5.  The knees do have mild effusion bilaterally.  Extension to 0 and flexion to 125.  McMurray's is nontender as well as flexion pinch.  All ligamentous structures are intact.  Radiology: No results found.  Assessment and Plan:     ICD-10-CM   1. Polyarthralgia  M25.50 Anti-CCP Ab, IgG + IgA (RDL)    Sedimentation rate    ANA,IFA RA Diag Pnl w/rflx Tit/Patn    High sensitivity CRP    Rheumatoid factor    Rheumatoid factor    CANCELED: High sensitivity CRP    CANCELED: Sedimentation rate    CANCELED: Cyclic citrul peptide antibody, IgG    CANCELED: Rheumatoid factor    CANCELED: Sedimentation rate    CANCELED: High sensitivity CRP    CANCELED: ANA,IFA RA Diag Pnl w/rflx  Tit/Patn    CANCELED: Rheumatoid factor  2. Myalgia  M79.10 Anti-CCP Ab, IgG + IgA (RDL)    Sedimentation rate    ANA,IFA RA Diag Pnl w/rflx Tit/Patn    High sensitivity CRP    Rheumatoid factor    Rheumatoid factor    CANCELED: High sensitivity CRP    CANCELED: Sedimentation rate    CANCELED: Cyclic citrul peptide antibody, IgG    CANCELED: Rheumatoid factor    CANCELED: Sedimentation rate    CANCELED: High sensitivity CRP    CANCELED: ANA,IFA RA Diag Pnl w/rflx Tit/Patn    CANCELED: Rheumatoid factor  3. Plantar fascial fibromatosis of both feet  M72.2   4. Family history of inflammatory bowel disease  Z83.79    Total encounter time: 30 minutes. On the day of the patient encounter, this can include review of  prior records, labs, and imaging.  Additional time can include counselling, consultation with peer MD in person or by telephone.  This also includes independent review of Radiology. Multijoint polyarthralgia and a 54 year old female.  Family history of ulcerative colitis and Crohn's.  We discussed all of this in full, and given all of her global symptoms I have some concern that she may have an underlying rheumatological process as a unifying factor.  If this work-up is negative, she likely has relatively diffuse osteoarthritis at a young age.  Fibromyalgia and or myofascial pain syndrome cannot be excluded.  Follow-up: No follow-ups on file.  No orders of the defined types were placed in this encounter.  Medications Discontinued During This Encounter  Medication Reason  . tizanidine (ZANAFLEX) 2 MG capsule Completed Course   Orders Placed This Encounter  Procedures  . Anti-CCP Ab, IgG + IgA (RDL)  . Sedimentation rate  . ANA,IFA RA Diag Pnl w/rflx Tit/Patn  . High sensitivity CRP  . Rheumatoid factor    Signed,  Reia Viernes T. Roshell Brigham, MD   Outpatient Encounter Medications as of 10/15/2019  Medication Sig  . cetirizine (ZYRTEC) 10 MG tablet Take 10 mg by mouth daily.  . Cholecalciferol 100 MCG (4000 UT) CAPS Take 1 capsule by mouth daily.  . fluticasone (FLONASE) 50 MCG/ACT nasal spray Place 2 sprays into both nostrils daily.  Marland Kitchen ibuprofen (ADVIL,MOTRIN) 200 MG tablet Take 200 mg by mouth as needed.  . Multiple Vitamin (MULTIVITAMIN) tablet Take 1 tablet by mouth daily.  Marland Kitchen omeprazole (PRILOSEC OTC) 20 MG tablet Take 20 mg by mouth daily.  . promethazine (PHENERGAN) 25 MG tablet Take 1 tablet (25 mg total) by mouth every 6 (six) hours as needed for nausea or vomiting.  . SUMAtriptan (IMITREX) 50 MG tablet Take 1-2 tablets (50-100 mg total) by mouth once for 1 dose. At start of headache symptoms  . [DISCONTINUED] tizanidine (ZANAFLEX) 2 MG capsule TAKE 1 CAPSULE (2 MG TOTAL) BY MOUTH AT  BEDTIME.   No facility-administered encounter medications on file as of 10/15/2019.

## 2019-10-16 LAB — SEDIMENTATION RATE: Sed Rate: 14 mm/hr (ref 0–40)

## 2019-10-16 LAB — SPECIMEN STATUS REPORT

## 2019-10-16 LAB — ANTI-CCP AB, IGG + IGA (RDL)

## 2019-10-17 ENCOUNTER — Ambulatory Visit: Payer: BC Managed Care – PPO | Attending: Internal Medicine

## 2019-10-17 DIAGNOSIS — Z23 Encounter for immunization: Secondary | ICD-10-CM

## 2019-10-17 LAB — SEDIMENTATION RATE

## 2019-10-17 LAB — ANA,IFA RA DIAG PNL W/RFLX TIT/PATN
ANA Titer 1: NEGATIVE
Cyclic Citrullin Peptide Ab: 4 units (ref 0–19)
Rheumatoid fact SerPl-aCnc: <10 IU/mL (ref 0.0–13.9)

## 2019-10-17 LAB — HIGH SENSITIVITY CRP: CRP, High Sensitivity: 1.16 mg/L (ref 0.00–3.00)

## 2019-10-17 NOTE — Progress Notes (Signed)
   Covid-19 Vaccination Clinic  Name:  Samantha Howell    MRN: [id number] DOB: 1965/12/28  10/17/2019  [name] was observed post Covid-19 immunization for 15 minutes without incident. She was provided with Vaccine Information Sheet and instruction to access the V-Safe system.   [name] was instructed to call 911 with any severe reactions post vaccine: Marland Kitchen Difficulty breathing  . Swelling of face and throat  . A fast heartbeat  . A bad rash all over body  . Dizziness and weakness   Immunizations Administered    Name Date Dose VIS Date Route   Moderna COVID-19 Vaccine 10/17/2019  8:55 AM 0.5 mL 07/01/2019 Intramuscular   Manufacturer: Moderna   Lot: [id number]   [id number]

## 2019-10-22 LAB — ANTI-CCP AB, IGG + IGA (RDL): Anti-CCP Ab, IgG + IgA (RDL): <20 Units (ref <20)

## 2019-10-22 LAB — SPECIMEN STATUS REPORT

## 2019-10-28 ENCOUNTER — Encounter: Payer: Self-pay | Admitting: Family Medicine

## 2019-10-29 ENCOUNTER — Encounter: Payer: Self-pay | Admitting: Family Medicine

## 2019-10-30 ENCOUNTER — Other Ambulatory Visit: Payer: Self-pay | Admitting: Family Medicine

## 2019-10-30 MED ORDER — PREDNISONE 20 MG PO TABS: ORAL_TABLET | ORAL | 0 refills | Status: DC

## 2019-10-30 MED ORDER — DICLOFENAC SODIUM 75 MG PO TBEC: 75.0000 mg | DELAYED_RELEASE_TABLET | Freq: Two times a day (BID) | ORAL | 3 refills | Status: DC

## 2019-11-05 MED ORDER — PREDNISONE 20 MG PO TABS: ORAL_TABLET | ORAL | 0 refills | Status: DC

## 2019-11-11 ENCOUNTER — Encounter: Payer: Self-pay | Admitting: Family Medicine

## 2019-11-19 ENCOUNTER — Ambulatory Visit: Payer: BC Managed Care – PPO | Attending: Internal Medicine

## 2019-11-19 DIAGNOSIS — Z23 Encounter for immunization: Secondary | ICD-10-CM

## 2019-11-19 NOTE — Progress Notes (Signed)
   Covid-19 Vaccination Clinic  Name:  Samantha Howell    MRN: [id number] DOB: [birth year]  11/19/2019  [name] was observed post Covid-19 immunization for 15 minutes without incident. She was provided with Vaccine Information Sheet and instruction to access the V-Safe system.   Ms. Stride was instructed to call 911 with any severe reactions post vaccine: Marland Kitchen Difficulty breathing  . Swelling of face and throat  . A fast heartbeat  . A bad rash all over body  . Dizziness and weakness   Immunizations Administered    Name Date Dose VIS Date Route   Moderna COVID-19 Vaccine 11/19/2019  9:02 AM 0.5 mL 07/2019 Intramuscular   Manufacturer: Moderna   Lot: [id number]   [place][id number]

## 2019-11-20 ENCOUNTER — Telehealth: Payer: Self-pay

## 2019-11-20 ENCOUNTER — Encounter: Payer: Self-pay | Admitting: Internal Medicine

## 2019-11-20 ENCOUNTER — Telehealth (INDEPENDENT_AMBULATORY_CARE_PROVIDER_SITE_OTHER): Payer: BC Managed Care – PPO | Admitting: Internal Medicine

## 2019-11-20 DIAGNOSIS — J0140 Acute pansinusitis, unspecified: Secondary | ICD-10-CM | POA: Diagnosis not present

## 2019-11-20 MED ORDER — AMOXICILLIN 500 MG PO TABS: 1000.0000 mg | ORAL_TABLET | Freq: Two times a day (BID) | ORAL | 0 refills | Status: AC

## 2019-11-20 NOTE — Assessment & Plan Note (Signed)
Has typical allergies--takes flonase, claritin,zyrtec 1 week of clear sinus symptoms and malaise Discussed symptomatic Rx Will try amoxil  Not related to COVID vaccine

## 2019-11-20 NOTE — Telephone Encounter (Signed)
[facility] - Client TELEPHONE ADVICE RECORD [facility] Patient Name: Samantha Howell Gender: Female DOB: 12/16/1965 Age: 54 Y 58 M 19 D Return Phone Number: [phone] (Primary) Address: City/State/Zip: [place] [address] Client [facility] - Client Client Site [place] Physician Viviana Simpler - MD Contact Type Call Who Is Calling Patient / Member / Family / Caregiver Call Type Triage / Clinical Relationship To Patient Self Return Phone Number [phone] (Primary) Chief Complaint Headache Reason for Call Symptomatic / Request for [place] states she thinks she has a sinus infection. She got her second Covid vaccine yesterday. She has questions regarding what medication she can take. She is experiencing a fever of 100.5, chills, a headache, body aches, sinus pressure and congestion. Translation No Nurse Assessment Nurse: Gildardo Pounds, RN, Amy Date/Time (Eastern Time): 11/20/2019 8:29:10 AM Confirm and document reason for call. If symptomatic, describe symptoms. ---Caller states she thinks she has a sinus infection. She got her second Covid vaccine yesterday. She has questions regarding what medication she can take. She is experiencing a fever of 100.5, chills, a headache, body aches, sinus pressure and congestion. Her sinuses ache & her fever is now 101.2. It started at 0300. Her ears hurt too. She gets sinus infections a lot. The sinus pressure & pain started. Has the patient had close contact with a person known or suspected to have the novel coronavirus illness OR traveled / lives in area with major community spread (including international travel) in the last 14 days from the onset of symptoms? * If Asymptomatic, screen for exposure and travel within the last 14 days. ---Yes Does the patient have any new or worsening symptoms? ---Yes Will a triage  be completed? ---Yes Related visit to physician within the last 2 weeks? ---No Does the PT have any chronic conditions? (i.e. diabetes, asthma, this includes High risk factors for pregnancy, etc.) ---No Is the patient pregnant or possibly pregnant? (Ask all females between the ages of 44-55) ---No Is this a behavioral health or substance abuse call? ---No PLEASE NOTE: All timestamps contained within this report are represented as [place] Standard Time. CONFIDENTIALTY NOTICE: This fax transmission is intended only for the addressee. It contains information that is legally privileged, confidential or otherwise protected from use or disclosure. If you are not the intended recipient, you are strictly prohibited from reviewing, disclosing, copying using or disseminating any of this information or taking any action in reliance on or regarding this information. If you have received this fax in error, please notify us immediately by telephone so that we can arrange for its return to [place]. Phone: [phone], Toll-Free: [phone], Fax: [phone] Page: 2 of 2 Call Id: [id number] Guidelines Guideline Title Affirmed Question Affirmed Notes Nurse Date/Time Eilene Ghazi Time) COVID-19 - Vaccine Questions and Reactions COVID-19 vaccine, systemic reactions (e.g., fatigue, fever, muscle aches), questions about Lovelace, RN, Amy 11/20/2019 8:32:12 AM Sinus Pain or Congestion [1] Sinus pain (not just congestion) AND [2] fever Lovelace, RN, Amy 11/20/2019 8:33:05 AM Disp. Time Eilene Ghazi Time) Disposition Final User 11/20/2019 8:32:55 [place], RN, Amy 11/20/2019 8:42:19 AM See PCP within 24 Hours Yes Lovelace, RN, Amy Caller Disagree/Comply Comply Caller Understands Yes PreDisposition InappropriateToAsk Care Advice Given Per Guideline HOME CARE: * You should be able to treat this at home. COVID-19 VACCINE - COMMON REACTIONS: * Local pain, redness, or swelling at injection site * Feeling  tired (fatigue) * Fever  and chills * Headache * Muscle aches or joint pains * Symptoms usually last 1 to 2 days. CALL BACK IF: * Fever lasts over 3 days * Pain at injection site not improving after 3 days * Swollen lymph node lasts over 3 weeks * You become worse. CARE ADVICE given per COVID-19 - Vaccine Questions and Reactions (Adult) guideline. PAIN AND FEVER MEDICINES: * For pain or fever relief, take either acetaminophen or ibuprofen. NASAL WASHES FOR A STUFFY NOSE: * Introduction: Saline (salt water) nasal irrigation (nasal wash) is an effective and simple home remedy for treating stuffy nose and sinus congestion. The nose can be irrigated by pouring, spraying, or squirting salt water into the nose and then letting it run back out. CALL BACK IF: * Difficulty breathing (and not relieved by cleaning out nose) * You become worse. CARE ADVICE given per Sinus Pain or Congestion (Adult) guideline. SEE PCP WITHIN 24 HOURS: * IF OFFICE WILL BE OPEN: You need to be seen within the next 24 hours. Call your doctor (or NP/PA) when the office opens and make an appointment. NASAL DECONGESTANTS FOR A VERY STUFFY NOSE: * If your nose feels blocked, you should try using nasal washes first. * Pseudoephedrine (Sudafed): Available over-the-counter in pill form. Typical adult dosage is two 30 mg tablets every 6 hours. Comments User: Wayne Sever, RN Date/Time Eilene Ghazi Time): 11/20/2019 8:44:24 AM Sinus pain & pressure started last Saturday. She has been using Sudafed & nasal washes since then. She has not taken Sudafed since getting the vaccine. She wants to know if she can take it after getting the vaccine. Advised consulting her pharmacist or asking the doctor when she has her appt. User: Wayne Sever, RN Date/Time Eilene Ghazi Time): 11/20/2019 8:46:47 AM Warm transferred caller to [facility] at the office to make an appt. Referrals REFERRED TO PCP OFFICE

## 2019-11-20 NOTE — Telephone Encounter (Signed)
Per appt desk pt already has my chart video visit 11/20/19 at 4 pm with Dr Silvio Pate.

## 2019-11-20 NOTE — Progress Notes (Signed)
Subjective:    Patient ID: Samantha Howell, female    DOB: 1966-01-06, 54 y.o.   MRN: [id number]  HPI Virtual visit due to respiratory symptoms Identification done Reviewed billing and she gave consent Participants---she is in her home and I am in my office  Has had allergies and then "turned into sinus" Bad orbital pai started about a week ago Waxing and waning Using nasal irrigation/flonase/tylenol/sudafed  Got second COVID vaccine yesterday Some fever/chills yesterday Fever better--still ~100  Some post nasal drip Some cough--mostly at night. Mostly non productive No SOB  Current Outpatient Medications on File Prior to Visit  Medication Sig Dispense Refill  . cetirizine (ZYRTEC) 10 MG tablet Take 10 mg by mouth daily.    . Cholecalciferol 100 MCG (4000 UT) CAPS Take 1 capsule by mouth daily.    . fluticasone (FLONASE) 50 MCG/ACT nasal spray Place 2 sprays into both nostrils daily. 16 g 1  . ibuprofen (ADVIL,MOTRIN) 200 MG tablet Take 200 mg by mouth as needed.    . Multiple Vitamin (MULTIVITAMIN) tablet Take 1 tablet by mouth daily.    Marland Kitchen omeprazole (PRILOSEC OTC) 20 MG tablet Take 20 mg by mouth daily.    . promethazine (PHENERGAN) 25 MG tablet Take 1 tablet (25 mg total) by mouth every 6 (six) hours as needed for nausea or vomiting. 30 tablet 1  . SUMAtriptan (IMITREX) 50 MG tablet Take 1-2 tablets (50-100 mg total) by mouth once for 1 dose. At start of headache symptoms 10 tablet 3   No current facility-administered medications on file prior to visit.    Allergies  Allergen Reactions  . Penicillins Nausea Only  . Sulfonamide Derivatives Hives    Happened when she was a child.    Past Medical History:  Diagnosis Date  . Abdominal pain   . Allergic rhinitis   . Allergy   . Anemia    history  . Fibrocystic breast   . GERD (gastroesophageal reflux disease)   . HLD (hyperlipidemia)    diet controlled, no meds  . Migraines   . Nausea   . SVD (spontaneous  vaginal delivery)    x 2    Past Surgical History:  Procedure Laterality Date  . BREAST EXCISIONAL BIOPSY Left 2001   benign  . BREAST LUMPECTOMY  12/00   Left breast  . CHOLECYSTECTOMY  06/13/2011   Procedure: LAPAROSCOPIC CHOLECYSTECTOMY WITH INTRAOPERATIVE CHOLANGIOGRAM;  Surgeon: Adin Hector, MD;  Location: [facility];  Service: General;  Laterality: N/A;  . TUBAL LIGATION  1992  . WISDOM TOOTH EXTRACTION      Family History  Problem Relation Age of Onset  . Heart attack Father   . Hyperlipidemia Father   . Hearing loss Father   . Atrial fibrillation Father   . Stroke Father   . Early menopause Mother   . Arthritis Mother   . Ovarian cancer Paternal Aunt   . Cancer Paternal Aunt        ovarian  . Cancer Maternal Grandmother        Bladder, breast  . Breast cancer Unknown        MGGM  . Crohn's disease Sister   . Ulcerative colitis Sister   . Colon cancer Neg Hx   . Esophageal cancer Neg Hx   . Rectal cancer Neg Hx   . Stomach cancer Neg Hx     Social History   Socioeconomic History  . Marital status: Married    Spouse name:  Not on file  . Number of children: 2  . Years of education: Not on file  . Highest education level: Not on file  Occupational History  . Occupation: Building control surveyor: [employer]  Tobacco Use  . Smoking status: Former Smoker    Packs/day: 0.25    Years: 4.00    Pack years: 1.00    Types: Cigarettes    Quit date: 07/31/1996    Years since quitting: 23.3  . Smokeless tobacco: Never Used  Substance and Sexual Activity  . Alcohol use: Yes    Alcohol/week: 0.0 standard drinks    Comment: Occasional beer  . Drug use: No  . Sexual activity: Not on file  Other Topics Concern  . 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 Difficulty of Paying Living Expenses:   Food Insecurity:   . Worried About Paediatric nurse in the Last Year:   . Arboriculturist in the Last Year:   Transportation Needs:   . Film/video editor (Medical):   Marland Kitchen Lack of Transportation (Non-Medical):   Physical Activity:   . Days of Exercise per Week:   . Minutes of Exercise per Session:   Stress:   . Feeling of Stress :   Social Connections:   . Frequency of Communication with Friends and Family:   . Frequency of Social Gatherings with Friends and Family:   . Attends Religious Services:   . Active Member of Clubs or Organizations:   . Attends Archivist Meetings:   Marland Kitchen Marital Status:   Intimate Partner Violence:   . Fear of Current or Ex-Partner:   . Emotionally Abused:   Marland Kitchen Physically Abused:   . Sexually Abused:    Review of Systems  Slight nausea today Appetite is okay--till today     Objective:   Physical Exam  Constitutional: No distress.  Appears mildly uncomfortable  HENT:  Has tenderness in frontal>maxillary areas  Respiratory: Effort normal. No respiratory distress.  Psychiatric: She has a normal mood and affect. Her behavior is normal.           Assessment & Plan:

## 2019-11-20 NOTE — Telephone Encounter (Signed)
Samantha Howell We will assess at the visit today

## 2019-12-05 MED ORDER — AMOXICILLIN-POT CLAVULANATE 875-125 MG PO TABS: 1.0000 | ORAL_TABLET | Freq: Two times a day (BID) | ORAL | 0 refills | Status: DC

## 2019-12-05 NOTE — Telephone Encounter (Signed)
Per this note Dr Silvio Pate has already taken care of this pts request.

## 2019-12-05 NOTE — Telephone Encounter (Signed)
[facility] Night - Client TELEPHONE ADVICE RECORD [facility] Patient Name: Samantha Howell Gender: Female DOB: 07-09-1966 Age: 54 Y 19 M 3 D Return Phone Number: [phone] (Primary) Address: City/State/Zip: [place] [address] Client [facility] - Client Client Site [place] Physician Viviana Simpler - MD Contact Type Call Who Is Calling Patient / Member / Family / Caregiver Call Type Triage / Clinical Relationship To Patient Self Return Phone Number [phone] (Primary) Chief Complaint Headache Reason for Call Medication Question / Request Initial Comment Caller states that she was given Amoxicillin for a sinus infection and has finished it. She still has the congestion and still with the headaches. She is wondering if the doctor could prescribe her another antibiotic or what does she need to do. She would like to get this ordered before the weekend. Translation No Nurse Assessment Nurse: Hassell Done, RN, Melanie Date/Time (Eastern Time): 12/04/2019 5:09:32 PM Confirm and document reason for call. If symptomatic, describe symptoms. ---Caller states she has finished a course of antibiotics and she still has a lot of drainage and headache. She started the antibiotic on the 22 and finished on the 29th. Making request for more antibiotics. Has the patient had close contact with a person known or suspected to have the novel coronavirus illness OR traveled / lives in area with major community spread (including international travel) in the last 14 days from the onset of symptoms? * If Asymptomatic, screen for exposure and travel within the last 14 days. ---No Does the patient have any new or worsening symptoms? ---Yes Will a triage be completed? ---Yes Related visit to physician within the last 2 weeks? ---Yes Does the PT have any chronic conditions? (i.e. diabetes, asthma, this includes High risk  factors for pregnancy, etc.) ---No Is the patient pregnant or possibly pregnant? (Ask all females between the ages of 38-55) ---No Is this a behavioral health or substance abuse call? ---No PLEASE NOTE: All timestamps contained within this report are represented as [place] Standard Time. CONFIDENTIALTY NOTICE: This fax transmission is intended only for the addressee. It contains information that is legally privileged, confidential or otherwise protected from use or disclosure. If you are not the intended recipient, you are strictly prohibited from reviewing, disclosing, copying using or disseminating any of this information or taking any action in reliance on or regarding this information. If you have received this fax in error, please notify us immediately by telephone so that we can arrange for its return to [place]. Phone: [phone], Toll-Free: [phone], Fax: [phone] Page: 2 of 2 Call Id: [id number] Guidelines Guideline Title Affirmed Question Affirmed Notes Nurse Date/Time Eilene Ghazi Time) Sinus Infection on Antibiotic Follow-up Call [1] Taking antibiotic > 7 days AND [2] nasal discharge not improved Arnaldo Natal 12/04/2019 5:11:54 PM Disp. Time Eilene Ghazi Time) Disposition Final User 12/04/2019 5:14:29 PM SEE PCP WITHIN 3 DAYS Yes Hassell Done, RN, Threasa Beards Caller Disagree/Comply Comply Caller Understands Yes PreDisposition Call Doctor Care Advice Given Per Guideline SEE PCP WITHIN 3 DAYS: * You need to be seen within 2 or 3 days. Call your doctor (or NP/PA) during regular office hours and make an appointment. A clinic or urgent care center are good places to go for care if your doctor's office is closed or you can't get an appointment. NOTE: If office will be open tomorrow, tell caller to call then, not in 3 days. CALL BACK IF: * Difficulty breathing (and not relieved by cleaning out  nose) * You become worse. CARE ADVICE given per Sinus Infection on Antibiotic Follow-Up Call  (Adult) guideline. Referrals REFERRED TO PCP OFFICE

## 2020-03-03 ENCOUNTER — Encounter: Payer: Self-pay | Admitting: Internal Medicine

## 2020-03-03 ENCOUNTER — Other Ambulatory Visit: Payer: Self-pay

## 2020-03-03 ENCOUNTER — Ambulatory Visit (INDEPENDENT_AMBULATORY_CARE_PROVIDER_SITE_OTHER): Payer: BC Managed Care – PPO | Admitting: Internal Medicine

## 2020-03-03 DIAGNOSIS — H6981 Other specified disorders of Eustachian tube, right ear: Secondary | ICD-10-CM | POA: Insufficient documentation

## 2020-03-03 MED ORDER — PREDNISONE 20 MG PO TABS: 40.0000 mg | ORAL_TABLET | Freq: Every day | ORAL | 0 refills | Status: DC

## 2020-03-03 NOTE — Patient Instructions (Signed)
Take the 3 days of prednisone. If it doesn't help, or your symptoms just come back, please set up an appointment with [facility] (let me know if you need help).

## 2020-03-03 NOTE — Assessment & Plan Note (Signed)
No infection Not clearly allergic Will try 3 days of prednisone  Will likely need ENT evaluation

## 2020-03-03 NOTE — Progress Notes (Signed)
Subjective:    Patient ID: Samantha Howell, female    DOB: [birth year], 54 y.o.   MRN: [id number]  HPI Here due to a stopped up right ear This visit occurred during the SARS-CoV-2 public health emergency.  Safety protocols were in place, including screening questions prior to the visit, additional usage of staff PPE, and extensive cleaning of exam room while observing appropriate contact time as indicated for disinfecting solutions.   Started about a month ago "It completely stops up" Minor pain but mostly just congested mucinex seemed to help--but then it came back Hearing is distorted when it is clogged Chewing gum has helped at times  Better today No recent travel  Current Outpatient Medications on File Prior to Visit  Medication Sig Dispense Refill  . cetirizine (ZYRTEC) 10 MG tablet Take 10 mg by mouth daily.    . Cholecalciferol 100 MCG (4000 UT) CAPS Take 1 capsule by mouth daily.    . fluticasone (FLONASE) 50 MCG/ACT nasal spray Place 2 sprays into both nostrils daily. 16 g 1  . ibuprofen (ADVIL,MOTRIN) 200 MG tablet Take 200 mg by mouth as needed.    . Multiple Vitamin (MULTIVITAMIN) tablet Take 1 tablet by mouth daily.    Marland Kitchen omeprazole (PRILOSEC OTC) 20 MG tablet Take 20 mg by mouth daily.    . promethazine (PHENERGAN) 25 MG tablet Take 1 tablet (25 mg total) by mouth every 6 (six) hours as needed for nausea or vomiting. 30 tablet 1  . SUMAtriptan (IMITREX) 50 MG tablet Take 1-2 tablets (50-100 mg total) by mouth once for 1 dose. At start of headache symptoms 10 tablet 3   No current facility-administered medications on file prior to visit.    Allergies  Allergen Reactions  . Sulfonamide Derivatives Hives    Happened when she was a child.    Past Medical History:  Diagnosis Date  . Abdominal pain   . Allergic rhinitis   . Allergy   . Anemia    history  . Fibrocystic breast   . GERD (gastroesophageal reflux disease)   . HLD (hyperlipidemia)    diet controlled,  no meds  . Migraines   . Nausea   . SVD (spontaneous vaginal delivery)    x 2    Past Surgical History:  Procedure Laterality Date  . BREAST EXCISIONAL BIOPSY Left 2001   benign  . BREAST LUMPECTOMY  12/00   Left breast  . CHOLECYSTECTOMY  06/13/2011   Procedure: LAPAROSCOPIC CHOLECYSTECTOMY WITH INTRAOPERATIVE CHOLANGIOGRAM;  Surgeon: Adin Hector, MD;  Location: [facility];  Service: General;  Laterality: N/A;  . TUBAL LIGATION  1992  . WISDOM TOOTH EXTRACTION      Family History  Problem Relation Age of Onset  . Heart attack Father   . Hyperlipidemia Father   . Hearing loss Father   . Atrial fibrillation Father   . Stroke Father   . Early menopause Mother   . Arthritis Mother   . Ovarian cancer Paternal Aunt   . Cancer Paternal Aunt        ovarian  . Cancer Maternal Grandmother        Bladder, breast  . Breast cancer Unknown        MGGM  . Crohn's disease Sister   . Ulcerative colitis Sister   . Colon cancer Neg Hx   . Esophageal cancer Neg Hx   . Rectal cancer Neg Hx   . Stomach cancer Neg Hx  Social History   Socioeconomic History  . Marital status: Married    Spouse name: Not on file  . Number of children: 2  . Years of education: Not on file  . Highest education level: Not on file  Occupational History  . Occupation: Building control surveyor: [employer]  Tobacco Use  . Smoking status: Former Smoker    Packs/day: 0.25    Years: 4.00    Pack years: 1.00    Types: Cigarettes    Quit date: 07/31/1996    Years since quitting: 23.6  . Smokeless tobacco: Never Used  Substance and Sexual Activity  . Alcohol use: Yes    Alcohol/week: 0.0 standard drinks    Comment: Occasional beer  . Drug use: No  . Sexual activity: Not on file  Other Topics Concern  . 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 Difficulty of Paying Living  Expenses:   Food Insecurity:   . Worried About Charity fundraiser in the Last Year:   . Arboriculturist in the Last Year:   Transportation Needs:   . Film/video editor (Medical):   Marland Kitchen Lack of Transportation (Non-Medical):   Physical Activity:   . Days of Exercise per Week:   . Minutes of Exercise per Session:   Stress:   . Feeling of Stress :   Social Connections:   . Frequency of Communication with Friends and Family:   . Frequency of Social Gatherings with Friends and Family:   . Attends Religious Services:   . Active Member of Clubs or Organizations:   . Attends Archivist Meetings:   Marland Kitchen Marital Status:   Intimate Partner Violence:   . Fear of Current or Ex-Partner:   . Emotionally Abused:   Marland Kitchen Physically Abused:   . Sexually Abused:    Review of Systems Low humming sound in right ear Sinus infection in April---took 2 rounds of antibiotic. No symptoms of acute infection Chronic allergies---not usually bad in summer (takes meds year round though)    Objective:   Physical Exam Constitutional:      Appearance: Normal appearance.  HENT:     Right Ear: Tympanic membrane and ear canal normal.     Left Ear: Tympanic membrane and ear canal normal.     Ears:     Comments: No tragal tenderness ?slight retraction on right    Mouth/Throat:     Comments: No oral lesions Neurological:     Mental Status: She is alert.            Assessment & Plan:

## 2020-03-25 DIAGNOSIS — Z20822 Contact with and (suspected) exposure to covid-19: Secondary | ICD-10-CM | POA: Diagnosis not present

## 2020-03-31 ENCOUNTER — Ambulatory Visit (INDEPENDENT_AMBULATORY_CARE_PROVIDER_SITE_OTHER): Payer: BC Managed Care – PPO | Admitting: Internal Medicine

## 2020-03-31 ENCOUNTER — Encounter: Payer: Self-pay | Admitting: Internal Medicine

## 2020-03-31 ENCOUNTER — Other Ambulatory Visit: Payer: Self-pay

## 2020-03-31 VITALS — BP 122/68 | HR 70 | Temp 97.8°F | Ht 59.25 in | Wt 135.0 lb

## 2020-03-31 DIAGNOSIS — E785 Hyperlipidemia, unspecified: Secondary | ICD-10-CM

## 2020-03-31 DIAGNOSIS — K219 Gastro-esophageal reflux disease without esophagitis: Secondary | ICD-10-CM

## 2020-03-31 DIAGNOSIS — J301 Allergic rhinitis due to pollen: Secondary | ICD-10-CM

## 2020-03-31 DIAGNOSIS — Z0000 Encounter for general adult medical examination without abnormal findings: Secondary | ICD-10-CM | POA: Diagnosis not present

## 2020-03-31 NOTE — Assessment & Plan Note (Signed)
Does okay with regimen

## 2020-03-31 NOTE — Progress Notes (Signed)
Subjective:    Patient ID: Samantha Howell, female    DOB: Jul 16, 1966, 54 y.o.   MRN: [id number]  HPI Here for physical This visit occurred during the SARS-CoV-2 public health emergency.  Safety protocols were in place, including screening questions prior to the visit, additional usage of staff PPE, and extensive cleaning of exam room while observing appropriate contact time as indicated for disinfecting solutions.   Her ear is better Did get ENT appointment---will keep just due to some mild symptoms  Allergies controlled with medications  Headaches are better Hasn't needed the imitrex  Uses the prilosec daily--and occasionally bid  Some burning on tongue---the twice a day helps this No dysphagia  Still walking from home Does a home exercise program Not walking as much--heat  Current Outpatient Medications on File Prior to Visit  Medication Sig Dispense Refill  . cetirizine (ZYRTEC) 10 MG tablet Take 10 mg by mouth daily.    . Cholecalciferol 100 MCG (4000 UT) CAPS Take 1 capsule by mouth daily.    . diclofenac (VOLTAREN) 75 MG EC tablet Take 75 mg by mouth 2 (two) times daily.    . fluticasone (FLONASE) 50 MCG/ACT nasal spray Place 2 sprays into both nostrils daily. 16 g 1  . ibuprofen (ADVIL,MOTRIN) 200 MG tablet Take 200 mg by mouth as needed.    . Multiple Vitamin (MULTIVITAMIN) tablet Take 1 tablet by mouth daily.    Marland Kitchen omeprazole (PRILOSEC OTC) 20 MG tablet Take 20 mg by mouth daily.    . promethazine (PHENERGAN) 25 MG tablet Take 1 tablet (25 mg total) by mouth every 6 (six) hours as needed for nausea or vomiting. 30 tablet 1  . SUMAtriptan (IMITREX) 50 MG tablet Take 1-2 tablets (50-100 mg total) by mouth once for 1 dose. At start of headache symptoms 10 tablet 3   No current facility-administered medications on file prior to visit.    Allergies  Allergen Reactions  . Sulfonamide Derivatives Hives    Happened when she was a child.    Past Medical History:    Diagnosis Date  . Abdominal pain   . Allergic rhinitis   . Allergy   . Anemia    history  . Fibrocystic breast   . GERD (gastroesophageal reflux disease)   . HLD (hyperlipidemia)    diet controlled, no meds  . Migraines   . Nausea   . SVD (spontaneous vaginal delivery)    x 2    Past Surgical History:  Procedure Laterality Date  . BREAST EXCISIONAL BIOPSY Left 2001   benign  . BREAST LUMPECTOMY  12/00   Left breast  . CHOLECYSTECTOMY  06/13/2011   Procedure: LAPAROSCOPIC CHOLECYSTECTOMY WITH INTRAOPERATIVE CHOLANGIOGRAM;  Surgeon: Adin Hector, MD;  Location: [facility];  Service: General;  Laterality: N/A;  . TUBAL LIGATION  1992  . WISDOM TOOTH EXTRACTION      Family History  Problem Relation Age of Onset  . Heart attack Father   . Hyperlipidemia Father   . Hearing loss Father   . Atrial fibrillation Father   . Stroke Father   . Early menopause Mother   . Arthritis Mother   . Ovarian cancer Paternal Aunt   . Cancer Paternal Aunt        ovarian  . Cancer Maternal Grandmother        Bladder, breast  . Breast cancer Unknown        MGGM  . Crohn's disease Sister   . Ulcerative  colitis Sister   . Colon cancer Neg Hx   . Esophageal cancer Neg Hx   . Rectal cancer Neg Hx   . Stomach cancer Neg Hx     Social History   Socioeconomic History  . Marital status: Married    Spouse name: Not on file  . Number of children: 2  . Years of education: Not on file  . Highest education level: Not on file  Occupational History  . Occupation: Building control surveyor: [employer]  Tobacco Use  . Smoking status: Former Smoker    Packs/day: 0.25    Years: 4.00    Pack years: 1.00    Types: Cigarettes    Quit date: 07/31/1996    Years since quitting: 23.6  . Smokeless tobacco: Never Used  Substance and Sexual Activity  . Alcohol use: Yes    Alcohol/week: 0.0 standard drinks    Comment: Occasional beer  . Drug use: No  . Sexual activity: Not on file  Other Topics Concern  .  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 Difficulty of Paying Living Expenses: Not on file  Food Insecurity:   . Worried About Charity fundraiser in the Last Year: Not on file  . Ran Out of Food in the Last Year: Not on file  Transportation Needs:   . Lack of Transportation (Medical): Not on file  . Lack of Transportation (Non-Medical): Not on file  Physical Activity:   . Days of Exercise per Week: Not on file  . Minutes of Exercise per Session: Not on file  Stress:   . Feeling of Stress : Not on file  Social Connections:   . Frequency of Communication with Friends and Family: Not on file  . Frequency of Social Gatherings with Friends and Family: Not on file  . Attends Religious Services: Not on file  . Active Member of Clubs or Organizations: Not on file  . Attends Archivist Meetings: Not on file  . Marital Status: Not on file  Intimate Partner Violence:   . Fear of Current or Ex-Partner: Not on file  . Emotionally Abused: Not on file  . Physically Abused: Not on file  . Sexually Abused: Not on file   Review of Systems  Constitutional: Negative for fatigue and unexpected weight change.       Wears seat belt  HENT: Negative for dental problem, hearing loss and trouble swallowing.        Keeps up with dentist  Eyes: Negative for visual disturbance.       No diplopia or unilateral vision loss  Respiratory: Negative for shortness of breath.        Some allergy cough  Cardiovascular:       Occasional vague chest pain---brief with walking and resolved quickly. At rest also Occasional edema in evening occ brief fluttering  Gastrointestinal: Negative for abdominal pain, blood in stool and constipation.  Endocrine: Negative for polydipsia and polyuria.  Genitourinary: Negative for dyspareunia, dysuria and hematuria.  Musculoskeletal:  Negative for back pain and joint swelling.       Joints are better since visit in the spring Hasn't needed diclofenac lately  Skin: Negative for rash.  Allergic/Immunologic: Positive for environmental allergies. Negative for immunocompromised state.  Neurological: Negative for dizziness, syncope  and light-headedness.  Hematological: Negative for adenopathy. Does not bruise/bleed easily.  Psychiatric/Behavioral: Negative for dysphoric mood and sleep disturbance. The patient is not nervous/anxious.        Objective:   Physical Exam Constitutional:      Appearance: Normal appearance.  HENT:     Head: Normocephalic and atraumatic.     Right Ear: Tympanic membrane, ear canal and external ear normal.     Left Ear: Tympanic membrane, ear canal and external ear normal.     Mouth/Throat:     Comments: No lesions Eyes:     Conjunctiva/sclera: Conjunctivae normal.     Pupils: Pupils are equal, round, and reactive to light.  Cardiovascular:     Rate and Rhythm: Normal rate and regular rhythm.     Pulses: Normal pulses.     Heart sounds: No murmur heard.  No gallop.   Pulmonary:     Effort: Pulmonary effort is normal.     Breath sounds: Normal breath sounds. No wheezing or rales.  Abdominal:     Palpations: Abdomen is soft.     Tenderness: There is no abdominal tenderness.  Musculoskeletal:        General: No swelling or tenderness.     Cervical back: Neck supple.     Right lower leg: No edema.     Left lower leg: No edema.  Lymphadenopathy:     Cervical: No cervical adenopathy.  Skin:    General: Skin is warm.     Findings: No rash.  Neurological:     General: No focal deficit present.     Mental Status: She is alert and oriented to person, place, and time.  Psychiatric:        Mood and Affect: Mood normal.        Behavior: Behavior normal.            Assessment & Plan:

## 2020-03-31 NOTE — Assessment & Plan Note (Signed)
Needs PPI daily or bid to control symptoms

## 2020-03-31 NOTE — Assessment & Plan Note (Signed)
Will recheck Not excited about primary prevention Vague chest symptoms--discussed what to look for to need evaluation

## 2020-03-31 NOTE — Assessment & Plan Note (Signed)
Healthy Discussed fitness Pap due 2022 Colon due 9/22 Mammogram next week COVID booster in fall and flu vaccine

## 2020-04-01 LAB — COMPREHENSIVE METABOLIC PANEL
ALT: 18 IU/L (ref 0–32)
AST: 17 IU/L (ref 0–40)
Albumin/Globulin Ratio: 2 (ref 1.2–2.2)
Albumin: 4.3 g/dL (ref 3.8–4.9)
Alkaline Phosphatase: 74 IU/L (ref 48–121)
BUN/Creatinine Ratio: 16 (ref 9–23)
BUN: 13 mg/dL (ref 6–24)
Bilirubin Total: <0.2 mg/dL (ref 0.0–1.2)
CO2: 23 mmol/L (ref 20–29)
Calcium: 9.5 mg/dL (ref 8.7–10.2)
Chloride: 106 mmol/L (ref 96–106)
Creatinine, Ser: 0.83 mg/dL (ref 0.57–1.00)
GFR calc Af Amer: 92 mL/min/{1.73_m2} (ref >59)
GFR calc non Af Amer: 80 mL/min/{1.73_m2} (ref >59)
Globulin, Total: 2.1 g/dL (ref 1.5–4.5)
Glucose: 101 mg/dL — ABNORMAL HIGH (ref 65–99)
Potassium: 4.4 mmol/L (ref 3.5–5.2)
Sodium: 143 mmol/L (ref 134–144)
Total Protein: 6.4 g/dL (ref 6.0–8.5)

## 2020-04-01 LAB — LIPID PANEL
Chol/HDL Ratio: 5 ratio — ABNORMAL HIGH (ref 0.0–4.4)
Cholesterol, Total: 233 mg/dL — ABNORMAL HIGH (ref 100–199)
HDL: 47 mg/dL (ref >39)
LDL Chol Calc (NIH): 156 mg/dL — ABNORMAL HIGH (ref 0–99)
Triglycerides: 165 mg/dL — ABNORMAL HIGH (ref 0–149)
VLDL Cholesterol Cal: 30 mg/dL (ref 5–40)

## 2020-04-01 LAB — CBC
Hematocrit: 41 % (ref 34.0–46.6)
Hemoglobin: 13.4 g/dL (ref 11.1–15.9)
MCH: 28.3 pg (ref 26.6–33.0)
MCHC: 32.7 g/dL (ref 31.5–35.7)
MCV: 87 fL (ref 79–97)
Platelets: 247 10*3/uL (ref 150–450)
RBC: 4.74 x10E6/uL (ref 3.77–5.28)
RDW: 12.9 % (ref 11.7–15.4)
WBC: 3.9 10*3/uL (ref 3.4–10.8)

## 2020-04-07 ENCOUNTER — Other Ambulatory Visit: Payer: Self-pay | Admitting: Internal Medicine

## 2020-04-07 DIAGNOSIS — Z1231 Encounter for screening mammogram for malignant neoplasm of breast: Secondary | ICD-10-CM

## 2020-04-13 DIAGNOSIS — H6980 Other specified disorders of Eustachian tube, unspecified ear: Secondary | ICD-10-CM | POA: Diagnosis not present

## 2020-04-13 DIAGNOSIS — J309 Allergic rhinitis, unspecified: Secondary | ICD-10-CM | POA: Diagnosis not present

## 2020-04-13 DIAGNOSIS — H6981 Other specified disorders of Eustachian tube, right ear: Secondary | ICD-10-CM | POA: Diagnosis not present

## 2020-04-13 DIAGNOSIS — J329 Chronic sinusitis, unspecified: Secondary | ICD-10-CM | POA: Diagnosis not present

## 2020-05-13 ENCOUNTER — Other Ambulatory Visit: Payer: Self-pay

## 2020-05-13 ENCOUNTER — Ambulatory Visit
Admission: RE | Admit: 2020-05-13 | Discharge: 2020-05-13 | Disposition: A | Discharge status: Home / Self Care | Payer: BC Managed Care – PPO | Source: Ambulatory Visit | Attending: Internal Medicine | Admitting: Internal Medicine

## 2020-05-13 DIAGNOSIS — Z1231 Encounter for screening mammogram for malignant neoplasm of breast: Secondary | ICD-10-CM | POA: Diagnosis not present

## 2020-11-19 MED ORDER — PROMETHAZINE HCL 25 MG PO TABS: 25.0000 mg | ORAL_TABLET | Freq: Four times a day (QID) | ORAL | 1 refills | Status: DC | PRN

## 2021-04-05 ENCOUNTER — Encounter: Payer: BC Managed Care – PPO | Admitting: Internal Medicine

## 2021-04-06 ENCOUNTER — Encounter: Payer: BC Managed Care – PPO | Admitting: Internal Medicine

## 2021-06-13 ENCOUNTER — Other Ambulatory Visit: Payer: Self-pay | Admitting: Internal Medicine

## 2021-06-13 DIAGNOSIS — Z1231 Encounter for screening mammogram for malignant neoplasm of breast: Secondary | ICD-10-CM

## 2021-07-04 ENCOUNTER — Encounter: Payer: Self-pay | Admitting: Internal Medicine

## 2021-07-04 NOTE — Telephone Encounter (Signed)
She is scheduled for tomorrow at 12. I have spoken to the pt.

## 2021-07-05 ENCOUNTER — Other Ambulatory Visit: Payer: Self-pay

## 2021-07-05 ENCOUNTER — Ambulatory Visit (INDEPENDENT_AMBULATORY_CARE_PROVIDER_SITE_OTHER): Payer: BC Managed Care – PPO | Admitting: Internal Medicine

## 2021-07-05 ENCOUNTER — Encounter: Payer: Self-pay | Admitting: Internal Medicine

## 2021-07-05 DIAGNOSIS — J0140 Acute pansinusitis, unspecified: Secondary | ICD-10-CM

## 2021-07-05 MED ORDER — AMOXICILLIN-POT CLAVULANATE 875-125 MG PO TABS: 1.0000 | ORAL_TABLET | Freq: Two times a day (BID) | ORAL | 0 refills | Status: DC

## 2021-07-05 NOTE — Progress Notes (Signed)
Subjective:    Patient ID: Samantha Howell, female    DOB: 03-Sep-1965, 55 y.o.   MRN: [id number]  HPI Here due to respiratory infection This visit occurred during the SARS-CoV-2 public health emergency.  Safety protocols were in place, including screening questions prior to the visit, additional usage of staff PPE, and extensive cleaning of exam room while observing appropriate contact time as indicated for disinfecting solutions.   Started 11/27 with sore throat x 1 day Has had post nasal drainage since then COVID test negative Then larygnitis Tried sudafed--not much help Tylenol severe sinus---slight help  Nasal rinses helping Still with lots of post nasal drip Hasn't been on the flonase due to blood from nose---doesn't feel stuffy  Some frontal pressure---better on the tylenol Did feel sinus pressure last night with cough Some ear itching--not painful No SOB Fairly regular sinus infections  Current Outpatient Medications on File Prior to Visit  Medication Sig Dispense Refill   cetirizine (ZYRTEC) 10 MG tablet Take 10 mg by mouth daily.     Cholecalciferol 100 MCG (4000 UT) CAPS Take 1 capsule by mouth daily.     fluticasone (FLONASE) 50 MCG/ACT nasal spray Place 2 sprays into both nostrils daily. 16 g 1   ibuprofen (ADVIL,MOTRIN) 200 MG tablet Take 200 mg by mouth as needed.     Multiple Vitamin (MULTIVITAMIN) tablet Take 1 tablet by mouth daily.     omeprazole (PRILOSEC OTC) 20 MG tablet Take 20 mg by mouth daily.     promethazine (PHENERGAN) 25 MG tablet Take 1 tablet (25 mg total) by mouth every 6 (six) hours as needed for nausea or vomiting. 30 tablet 1   SUMAtriptan (IMITREX) 50 MG tablet Take 1-2 tablets (50-100 mg total) by mouth once for 1 dose. At start of headache symptoms 10 tablet 3   No current facility-administered medications on file prior to visit.    Allergies  Allergen Reactions   Sulfonamide Derivatives Hives    Happened when she was a child.     Past Medical History:  Diagnosis Date   Abdominal pain    Allergic rhinitis    Allergy    Anemia    history   Fibrocystic breast    GERD (gastroesophageal reflux disease)    HLD (hyperlipidemia)    diet controlled, no meds   Migraines    Nausea    SVD (spontaneous vaginal delivery)    x 2    Past Surgical History:  Procedure Laterality Date   BREAST EXCISIONAL BIOPSY Left 2001   benign   BREAST LUMPECTOMY  12/00   Left breast   CHOLECYSTECTOMY  06/13/2011   Procedure: LAPAROSCOPIC CHOLECYSTECTOMY WITH INTRAOPERATIVE CHOLANGIOGRAM;  Surgeon: Adin Hector, MD;  Location: [facility];  Service: General;  Laterality: N/A;   TUBAL LIGATION  1992   WISDOM TOOTH EXTRACTION      Family History  Problem Relation Age of Onset   Heart attack Father    Hyperlipidemia Father    Hearing loss Father    Atrial fibrillation Father    Stroke Father    Early menopause Mother    Arthritis Mother    Ovarian cancer Paternal Aunt    Cancer Paternal Aunt        ovarian   Cancer Maternal Grandmother        Bladder, breast   Breast cancer Other        MGGM   Crohn's disease Sister    Ulcerative colitis Sister  Colon cancer Neg Hx    Esophageal cancer Neg Hx    Rectal cancer Neg Hx    Stomach cancer Neg Hx     Social History   Socioeconomic History   Marital status: Married    Spouse name: Not on file   Number of children: 2   Years of education: Not on file   Highest education level: Not on file  Occupational History   Occupation: IT    Employer: [employer]  Tobacco Use   Smoking status: Former    Packs/day: 0.25    Years: 4.00    Pack years: 1.00    Types: Cigarettes    Quit date: 07/31/1996    Years since quitting: 24.9   Smokeless tobacco: Never  Substance and Sexual Activity   Alcohol use: Yes    Alcohol/week: 0.0 standard drinks    Comment: Occasional beer   Drug use: No   Sexual activity: Not on file  Other Topics Concern   Not on file  Social History  Narrative   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  HENT:     Head:     Comments: No sinus tenderness    Nose:     Comments: Moderate pale congestion    Mouth/Throat:     Pharynx: No oropharyngeal exudate or posterior oropharyngeal erythema.  Pulmonary:     Effort: Pulmonary effort is normal.     Breath sounds: Normal breath sounds. No wheezing or rales.  Musculoskeletal:     Cervical back: Neck supple.  Lymphadenopathy:     Cervical: No cervical adenopathy.  Neurological:     Mental Status: She is alert.           Assessment & Plan:

## 2021-07-05 NOTE — Assessment & Plan Note (Signed)
Has been sick for 9-10 days and settling in sinuses---and she has chronic issues Not clearly allergy related---had viral infection--does take zyrtec Probably worth trying the flonase again Will give augmentin Continue tylenol

## 2021-07-08 DIAGNOSIS — L821 Other seborrheic keratosis: Secondary | ICD-10-CM | POA: Diagnosis not present

## 2021-07-08 DIAGNOSIS — D2372 Other benign neoplasm of skin of left lower limb, including hip: Secondary | ICD-10-CM | POA: Diagnosis not present

## 2021-07-08 DIAGNOSIS — I788 Other diseases of capillaries: Secondary | ICD-10-CM | POA: Diagnosis not present

## 2021-07-08 DIAGNOSIS — Z1283 Encounter for screening for malignant neoplasm of skin: Secondary | ICD-10-CM | POA: Diagnosis not present

## 2021-07-13 ENCOUNTER — Ambulatory Visit (INDEPENDENT_AMBULATORY_CARE_PROVIDER_SITE_OTHER): Payer: BC Managed Care – PPO | Admitting: Internal Medicine

## 2021-07-13 ENCOUNTER — Other Ambulatory Visit: Payer: Self-pay

## 2021-07-13 ENCOUNTER — Other Ambulatory Visit (HOSPITAL_COMMUNITY)
Admission: RE | Admit: 2021-07-13 | Discharge: 2021-07-13 | Disposition: A | Discharge status: Home / Self Care | Payer: BC Managed Care – PPO | Source: Ambulatory Visit | Attending: Internal Medicine | Admitting: Internal Medicine

## 2021-07-13 ENCOUNTER — Encounter: Payer: Self-pay | Admitting: Internal Medicine

## 2021-07-13 VITALS — BP 128/80 | HR 76 | Temp 97.2°F | Ht 59.0 in | Wt 126.0 lb

## 2021-07-13 DIAGNOSIS — Z0000 Encounter for general adult medical examination without abnormal findings: Secondary | ICD-10-CM | POA: Insufficient documentation

## 2021-07-13 DIAGNOSIS — E785 Hyperlipidemia, unspecified: Secondary | ICD-10-CM

## 2021-07-13 DIAGNOSIS — J301 Allergic rhinitis due to pollen: Secondary | ICD-10-CM

## 2021-07-13 DIAGNOSIS — Z23 Encounter for immunization: Secondary | ICD-10-CM | POA: Diagnosis not present

## 2021-07-13 DIAGNOSIS — K2100 Gastro-esophageal reflux disease with esophagitis, without bleeding: Secondary | ICD-10-CM | POA: Diagnosis not present

## 2021-07-13 DIAGNOSIS — G4489 Other headache syndrome: Secondary | ICD-10-CM | POA: Diagnosis not present

## 2021-07-13 NOTE — Addendum Note (Signed)
Addended by: Pilar Grammes on: 07/13/2021 01:07 PM   Modules accepted: Orders

## 2021-07-13 NOTE — Assessment & Plan Note (Signed)
Does fine with the cetirizine and flonase

## 2021-07-13 NOTE — Assessment & Plan Note (Signed)
Healthy Pap done today Has mammogram soon Colon due again 2024 Needs to start exercise shingrix today Had bivalent COVID and flu vaccines

## 2021-07-13 NOTE — Assessment & Plan Note (Signed)
Still prefers no statin for primary prevention

## 2021-07-13 NOTE — Progress Notes (Signed)
Subjective:    Patient ID: Samantha Howell, female    DOB: May 04, 1966, 55 y.o.   MRN: [id number]  HPI Here for physical  Is feeling better Augmentin did seem to help  Never took the imitrex Does well with phenergan and advil  Discussed cholesterol again Not really exercising---very busy at work Tries to eat healthy----weight is down some from last year (with intermittent fasting and lower carbs)  Current Outpatient Medications on File Prior to Visit  Medication Sig Dispense Refill   cetirizine (ZYRTEC) 10 MG tablet Take 10 mg by mouth daily.     Cholecalciferol 100 MCG (4000 UT) CAPS Take 1 capsule by mouth daily.     fluticasone (FLONASE) 50 MCG/ACT nasal spray Place 2 sprays into both nostrils daily. 16 g 1   ibuprofen (ADVIL,MOTRIN) 200 MG tablet Take 200 mg by mouth as needed.     Multiple Vitamin (MULTIVITAMIN) tablet Take 1 tablet by mouth daily.     omeprazole (PRILOSEC OTC) 20 MG tablet Take 20 mg by mouth daily.     promethazine (PHENERGAN) 25 MG tablet Take 1 tablet (25 mg total) by mouth every 6 (six) hours as needed for nausea or vomiting. 30 tablet 1   No current facility-administered medications on file prior to visit.    Allergies  Allergen Reactions   Sulfonamide Derivatives Hives    Happened when she was a child.    Past Medical History:  Diagnosis Date   Abdominal pain    Allergic rhinitis    Allergy    Anemia    history   Fibrocystic breast    GERD (gastroesophageal reflux disease)    HLD (hyperlipidemia)    diet controlled, no meds   Migraines    Nausea    SVD (spontaneous vaginal delivery)    x 2    Past Surgical History:  Procedure Laterality Date   BREAST EXCISIONAL BIOPSY Left 2001   benign   BREAST LUMPECTOMY  12/00   Left breast   CHOLECYSTECTOMY  06/13/2011   Procedure: LAPAROSCOPIC CHOLECYSTECTOMY WITH INTRAOPERATIVE CHOLANGIOGRAM;  Surgeon: Adin Hector, MD;  Location: [facility];  Service: General;  Laterality: N/A;   TUBAL  LIGATION  1992   WISDOM TOOTH EXTRACTION      Family History  Problem Relation Age of Onset   Heart attack Father    Hyperlipidemia Father    Hearing loss Father    Atrial fibrillation Father    Stroke Father    Early menopause Mother    Arthritis Mother    Ovarian cancer Paternal Aunt    Cancer Paternal Aunt        ovarian   Cancer Maternal Grandmother        Bladder, breast   Breast cancer Other        MGGM   Crohn's disease Sister    Ulcerative colitis Sister    Colon cancer Neg Hx    Esophageal cancer Neg Hx    Rectal cancer Neg Hx    Stomach cancer Neg Hx     Social History   Socioeconomic History   Marital status: Married    Spouse name: Not on file   Number of children: 2   Years of education: Not on file   Highest education level: Not on file  Occupational History   Occupation: Building control surveyor: [employer]  Tobacco Use   Smoking status: Former    Packs/day: 0.25    Years: 4.00  Pack years: 1.00    Types: Cigarettes    Quit date: 07/31/1996    Years since quitting: 24.9   Smokeless tobacco: Never  Substance and Sexual Activity   Alcohol use: Yes    Alcohol/week: 0.0 standard drinks    Comment: Occasional beer   Drug use: No   Sexual activity: Not on file  Other Topics Concern   Not on file  Social History Narrative   IT department (contracts) for [employer]      Married; 2 children      Social Determinants of Health   Financial Resource Strain: Not on file  Food Insecurity: Not on file  Transportation Needs: Not on file  Physical Activity: Not on file  Stress: Not on file  Social Connections: Not on file  Intimate Partner Violence: Not on file   Review of Systems  Constitutional:  Negative for fatigue and unexpected weight change.       Wears seat belt  HENT:  Negative for dental problem, hearing loss and tinnitus.        Some tongue burning Keeps up with dentist  Eyes:  Negative for visual disturbance.       No diplopia or unilateral  vision loss  Respiratory:  Negative for cough, chest tightness and shortness of breath.   Cardiovascular:  Negative for chest pain and leg swelling.       Gets flutter feeling--but very brief Light pain towards left shoulder---at rest  Gastrointestinal:  Negative for constipation.       Gets some blood---in water or toilet paper Some heartburn if misses med  Endocrine: Negative for polydipsia and polyuria.  Genitourinary:  Negative for dyspareunia, dysuria and hematuria.  Musculoskeletal:  Negative for arthralgias, back pain and joint swelling.  Skin:  Negative for rash.       Just had derm visit  Allergic/Immunologic: Positive for environmental allergies. Negative for immunocompromised state.       Takes cetirizine and flonase daily  Neurological:  Positive for headaches. Negative for dizziness, syncope and light-headedness.  Hematological:  Negative for adenopathy. Does not bruise/bleed easily.  Psychiatric/Behavioral:  Negative for dysphoric mood. The patient is not nervous/anxious.        Light sleeper--never great      Objective:   Physical Exam Constitutional:      Appearance: Normal appearance.  HENT:     Right Ear: Tympanic membrane and ear canal normal.     Left Ear: Tympanic membrane and ear canal normal.     Mouth/Throat:     Pharynx: No oropharyngeal exudate or posterior oropharyngeal erythema.  Eyes:     Conjunctiva/sclera: Conjunctivae normal.     Pupils: Pupils are equal, round, and reactive to light.  Cardiovascular:     Rate and Rhythm: Normal rate and regular rhythm.     Pulses: Normal pulses.     Heart sounds: No murmur heard.   No gallop.  Pulmonary:     Effort: Pulmonary effort is normal.     Breath sounds: Normal breath sounds. No wheezing or rales.  Abdominal:     Palpations: Abdomen is soft.     Tenderness: There is no abdominal tenderness.  Genitourinary:    Comments: Normal introitus Cervix appears normal---pap done Musculoskeletal:      Cervical back: Neck supple.     Right lower leg: No edema.     Left lower leg: No edema.  Lymphadenopathy:     Cervical: No cervical adenopathy.  Skin:  Findings: No lesion or rash.  Neurological:     General: No focal deficit present.     Mental Status: She is alert and oriented to person, place, and time.  Psychiatric:        Mood and Affect: Mood normal.        Behavior: Behavior normal.           Assessment & Plan:

## 2021-07-13 NOTE — Assessment & Plan Note (Signed)
Generally quiet as long as she takes her omeprazole

## 2021-07-13 NOTE — Assessment & Plan Note (Signed)
Does okay with phenergan and ibuprofen

## 2021-07-14 LAB — COMPREHENSIVE METABOLIC PANEL
ALT: 44 IU/L — ABNORMAL HIGH (ref 0–32)
AST: 26 IU/L (ref 0–40)
Albumin/Globulin Ratio: 2.8 — ABNORMAL HIGH (ref 1.2–2.2)
Albumin: 5.1 g/dL — ABNORMAL HIGH (ref 3.8–4.9)
Alkaline Phosphatase: 78 IU/L (ref 44–121)
BUN/Creatinine Ratio: 10 (ref 9–23)
BUN: 9 mg/dL (ref 6–24)
Bilirubin Total: 0.3 mg/dL (ref 0.0–1.2)
CO2: 25 mmol/L (ref 20–29)
Calcium: 10 mg/dL (ref 8.7–10.2)
Chloride: 101 mmol/L (ref 96–106)
Creatinine, Ser: 0.87 mg/dL (ref 0.57–1.00)
Globulin, Total: 1.8 g/dL (ref 1.5–4.5)
Glucose: 89 mg/dL (ref 70–99)
Potassium: 3.7 mmol/L (ref 3.5–5.2)
Sodium: 142 mmol/L (ref 134–144)
Total Protein: 6.9 g/dL (ref 6.0–8.5)
eGFR: 78 mL/min/{1.73_m2} (ref >59)

## 2021-07-14 LAB — LIPID PANEL
Chol/HDL Ratio: 4.6 ratio — ABNORMAL HIGH (ref 0.0–4.4)
Cholesterol, Total: 283 mg/dL — ABNORMAL HIGH (ref 100–199)
HDL: 61 mg/dL (ref >39)
LDL Chol Calc (NIH): 197 mg/dL — ABNORMAL HIGH (ref 0–99)
Triglycerides: 140 mg/dL (ref 0–149)
VLDL Cholesterol Cal: 25 mg/dL (ref 5–40)

## 2021-07-14 LAB — CYTOLOGY - PAP
Comment: NEGATIVE
Diagnosis: NEGATIVE
High risk HPV: NEGATIVE

## 2021-07-14 LAB — VITAMIN B12: Vitamin B-12: 1217 pg/mL (ref 232–1245)

## 2021-07-14 LAB — CBC
Hematocrit: 42.4 % (ref 34.0–46.6)
Hemoglobin: 14.2 g/dL (ref 11.1–15.9)
MCH: 28.5 pg (ref 26.6–33.0)
MCHC: 33.5 g/dL (ref 31.5–35.7)
MCV: 85 fL (ref 79–97)
Platelets: 271 10*3/uL (ref 150–450)
RBC: 4.98 x10E6/uL (ref 3.77–5.28)
RDW: 13.2 % (ref 11.7–15.4)
WBC: 5.7 10*3/uL (ref 3.4–10.8)

## 2021-07-14 LAB — T4, FREE: Free T4: 1.26 ng/dL (ref 0.82–1.77)

## 2021-07-20 ENCOUNTER — Ambulatory Visit
Admission: RE | Admit: 2021-07-20 | Discharge: 2021-07-20 | Disposition: A | Discharge status: Home / Self Care | Payer: BC Managed Care – PPO | Source: Ambulatory Visit | Attending: Internal Medicine | Admitting: Internal Medicine

## 2021-07-20 DIAGNOSIS — Z1231 Encounter for screening mammogram for malignant neoplasm of breast: Secondary | ICD-10-CM

## 2021-11-15 ENCOUNTER — Ambulatory Visit (INDEPENDENT_AMBULATORY_CARE_PROVIDER_SITE_OTHER): Payer: BC Managed Care – PPO

## 2021-11-15 DIAGNOSIS — Z23 Encounter for immunization: Secondary | ICD-10-CM

## 2021-11-15 NOTE — Progress Notes (Signed)
Patient presented for 2nd shingles vaccine given by Hazen Brumett, CMA to left deltoid, patient voiced no concerns nor showed any signs of distress during injection.  

## 2022-01-05 DIAGNOSIS — H9201 Otalgia, right ear: Secondary | ICD-10-CM | POA: Diagnosis not present

## 2022-01-20 ENCOUNTER — Other Ambulatory Visit: Payer: Self-pay | Admitting: Unknown Physician Specialty

## 2022-01-20 DIAGNOSIS — H9209 Otalgia, unspecified ear: Secondary | ICD-10-CM

## 2022-01-20 DIAGNOSIS — H9201 Otalgia, right ear: Secondary | ICD-10-CM

## 2022-01-20 DIAGNOSIS — M542 Cervicalgia: Secondary | ICD-10-CM

## 2022-02-02 ENCOUNTER — Ambulatory Visit
Admission: RE | Admit: 2022-02-02 | Discharge: 2022-02-02 | Disposition: A | Discharge status: Home / Self Care | Payer: BC Managed Care – PPO | Source: Ambulatory Visit | Attending: Unknown Physician Specialty | Admitting: Unknown Physician Specialty

## 2022-02-02 DIAGNOSIS — H9201 Otalgia, right ear: Secondary | ICD-10-CM | POA: Diagnosis not present

## 2022-02-02 DIAGNOSIS — M542 Cervicalgia: Secondary | ICD-10-CM

## 2022-02-02 DIAGNOSIS — F4322 Adjustment disorder with anxiety: Secondary | ICD-10-CM | POA: Diagnosis not present

## 2022-02-02 DIAGNOSIS — H9209 Otalgia, unspecified ear: Secondary | ICD-10-CM

## 2022-02-02 MED ORDER — GADOBENATE DIMEGLUMINE 529 MG/ML IV SOLN
11.0000 mL | Freq: Once | INTRAVENOUS | Status: AC | PRN
  Administered 2022-02-02: 11 mL via INTRAVENOUS

## 2022-02-16 DIAGNOSIS — F4322 Adjustment disorder with anxiety: Secondary | ICD-10-CM | POA: Diagnosis not present

## 2022-03-02 DIAGNOSIS — F4322 Adjustment disorder with anxiety: Secondary | ICD-10-CM | POA: Diagnosis not present

## 2022-03-16 DIAGNOSIS — F4322 Adjustment disorder with anxiety: Secondary | ICD-10-CM | POA: Diagnosis not present

## 2022-03-30 DIAGNOSIS — F4322 Adjustment disorder with anxiety: Secondary | ICD-10-CM | POA: Diagnosis not present

## 2022-04-13 DIAGNOSIS — F4322 Adjustment disorder with anxiety: Secondary | ICD-10-CM | POA: Diagnosis not present

## 2022-05-11 DIAGNOSIS — F4322 Adjustment disorder with anxiety: Secondary | ICD-10-CM | POA: Diagnosis not present

## 2022-05-25 DIAGNOSIS — F4322 Adjustment disorder with anxiety: Secondary | ICD-10-CM | POA: Diagnosis not present

## 2022-06-12 ENCOUNTER — Other Ambulatory Visit: Payer: Self-pay | Admitting: Internal Medicine

## 2022-06-12 DIAGNOSIS — Z1231 Encounter for screening mammogram for malignant neoplasm of breast: Secondary | ICD-10-CM

## 2022-06-12 DIAGNOSIS — F4322 Adjustment disorder with anxiety: Secondary | ICD-10-CM | POA: Diagnosis not present

## 2022-06-13 DIAGNOSIS — L710 Perioral dermatitis: Secondary | ICD-10-CM | POA: Diagnosis not present

## 2022-06-27 DIAGNOSIS — H9201 Otalgia, right ear: Secondary | ICD-10-CM | POA: Diagnosis not present

## 2022-06-27 DIAGNOSIS — Z820 Family history of epilepsy and other diseases of the nervous system: Secondary | ICD-10-CM | POA: Diagnosis not present

## 2022-06-27 DIAGNOSIS — Z8619 Personal history of other infectious and parasitic diseases: Secondary | ICD-10-CM | POA: Diagnosis not present

## 2022-06-27 DIAGNOSIS — G43829 Menstrual migraine, not intractable, without status migrainosus: Secondary | ICD-10-CM | POA: Diagnosis not present

## 2022-07-13 DIAGNOSIS — F4322 Adjustment disorder with anxiety: Secondary | ICD-10-CM | POA: Diagnosis not present

## 2022-07-18 ENCOUNTER — Encounter: Payer: BC Managed Care – PPO | Admitting: Internal Medicine

## 2022-07-21 ENCOUNTER — Ambulatory Visit
Admission: RE | Admit: 2022-07-21 | Discharge: 2022-07-21 | Disposition: A | Discharge status: Home / Self Care | Payer: BC Managed Care – PPO | Source: Ambulatory Visit | Attending: Internal Medicine | Admitting: Internal Medicine

## 2022-07-21 DIAGNOSIS — Z1231 Encounter for screening mammogram for malignant neoplasm of breast: Secondary | ICD-10-CM | POA: Diagnosis not present

## 2022-07-25 ENCOUNTER — Ambulatory Visit (INDEPENDENT_AMBULATORY_CARE_PROVIDER_SITE_OTHER): Payer: BC Managed Care – PPO | Admitting: Internal Medicine

## 2022-07-25 ENCOUNTER — Encounter: Payer: Self-pay | Admitting: Internal Medicine

## 2022-07-25 VITALS — BP 130/78 | HR 68 | Temp 97.4°F | Ht 59.0 in | Wt 124.0 lb

## 2022-07-25 DIAGNOSIS — R030 Elevated blood-pressure reading, without diagnosis of hypertension: Secondary | ICD-10-CM

## 2022-07-25 DIAGNOSIS — Z0000 Encounter for general adult medical examination without abnormal findings: Secondary | ICD-10-CM

## 2022-07-25 DIAGNOSIS — J301 Allergic rhinitis due to pollen: Secondary | ICD-10-CM

## 2022-07-25 DIAGNOSIS — E785 Hyperlipidemia, unspecified: Secondary | ICD-10-CM

## 2022-07-25 MED ORDER — PROMETHAZINE HCL 25 MG PO TABS
25.0000 mg | ORAL_TABLET | Freq: Four times a day (QID) | ORAL | 1 refills | Status: DC | PRN
Start: 2022-07-25 — End: 2024-02-22

## 2022-07-25 MED ORDER — AMOXICILLIN-POT CLAVULANATE 875-125 MG PO TABS: 1.0000 | ORAL_TABLET | Freq: Two times a day (BID) | ORAL | 0 refills | Status: DC

## 2022-07-25 NOTE — Patient Instructions (Signed)
Please monitor your blood pressure--- no more than once a day.  If you have persistent elevations over 140/90--than we should start medications.

## 2022-07-25 NOTE — Assessment & Plan Note (Signed)
If LDL over 190, she will likely be willing to start statin (rosuvastatin 10)

## 2022-07-25 NOTE — Assessment & Plan Note (Signed)
Generally satisfied with her meds Now has secondary sinus infection---will treat with augmentin

## 2022-07-25 NOTE — Assessment & Plan Note (Signed)
She will monitor at home If persistent elevation over 140/90 would start valsartan 80

## 2022-07-25 NOTE — Progress Notes (Signed)
Subjective:    Patient ID: Samantha Howell, female    DOB: September 28, 1965, 56 y.o.   MRN: [id number]  HPI Here for physical  Still having ear problems MRI reassuring Dr Tami Ribas sent him to Dr Manuella Ghazi Does have TMJ and now using mouthguard and is better Hasn't needed the muscle relaxer Headache in general is better---though having some congestion over the past week or so Feels like it is going into sinus infection  Has had some elevation of her BP Lots of stress---like helping care for MIL, who recently passed Taking BP daily at different times Most systolic under 229 and occasionally under 100.  Diastolic occasionally in 79'G--XQJ often in 70's also Taking sudafed and some advil at times  Current Outpatient Medications on File Prior to Visit  Medication Sig Dispense Refill   baclofen (LIORESAL) 10 MG tablet Take 10 mg by mouth 2 (two) times daily as needed.     cetirizine (ZYRTEC) 10 MG tablet Take 10 mg by mouth daily.     Cholecalciferol 100 MCG (4000 UT) CAPS Take 1 capsule by mouth daily.     fluticasone (FLONASE) 50 MCG/ACT nasal spray Place 2 sprays into both nostrils daily. 16 g 1   ibuprofen (ADVIL,MOTRIN) 200 MG tablet Take 200 mg by mouth as needed.     Multiple Vitamin (MULTIVITAMIN) tablet Take 1 tablet by mouth daily.     omeprazole (PRILOSEC OTC) 20 MG tablet Take 20 mg by mouth daily.     promethazine (PHENERGAN) 25 MG tablet Take 1 tablet (25 mg total) by mouth every 6 (six) hours as needed for nausea or vomiting. 30 tablet 1   No current facility-administered medications on file prior to visit.    Allergies  Allergen Reactions   Sulfonamide Derivatives Hives    Happened when she was a child.    Past Medical History:  Diagnosis Date   Abdominal pain    Allergic rhinitis    Allergy    Anemia    history   Fibrocystic breast    GERD (gastroesophageal reflux disease)    HLD (hyperlipidemia)    diet controlled, no meds   Migraines    Nausea    SVD  (spontaneous vaginal delivery)    x 2    Past Surgical History:  Procedure Laterality Date   BREAST EXCISIONAL BIOPSY Left 2001   benign   BREAST LUMPECTOMY  12/00   Left breast   CHOLECYSTECTOMY  06/13/2011   Procedure: LAPAROSCOPIC CHOLECYSTECTOMY WITH INTRAOPERATIVE CHOLANGIOGRAM;  Surgeon: Adin Hector, MD;  Location: [facility];  Service: General;  Laterality: N/A;   TUBAL LIGATION  1992   WISDOM TOOTH EXTRACTION      Family History  Problem Relation Age of Onset   Heart attack Father    Hyperlipidemia Father    Hearing loss Father    Atrial fibrillation Father    Stroke Father    Early menopause Mother    Arthritis Mother    Ovarian cancer Paternal Aunt    Cancer Paternal Aunt        ovarian   Cancer Maternal Grandmother        Bladder, breast   Breast cancer Other        MGGM   Crohn's disease Sister    Ulcerative colitis Sister    Colon cancer Neg Hx    Esophageal cancer Neg Hx    Rectal cancer Neg Hx    Stomach cancer Neg Hx  Social History   Socioeconomic History   Marital status: Married    Spouse name: Not on file   Number of children: 2   Years of education: Not on file   Highest education level: Not on file  Occupational History   Occupation: IT    Employer: [employer]  Tobacco Use   Smoking status: Former    Packs/day: 0.25    Years: 4.00    Total pack years: 1.00    Types: Cigarettes    Quit date: 07/31/1996    Years since quitting: 26.0    Passive exposure: Never   Smokeless tobacco: Never  Substance and Sexual Activity   Alcohol use: Yes    Alcohol/week: 0.0 standard drinks of alcohol    Comment: Occasional beer   Drug use: No   Sexual activity: Not on file  Other Topics Concern   Not on file  Social History Narrative   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        Keeps up with dentist  Eyes:  Negative for visual disturbance.       No diplopia or unilateral vision loss  Respiratory:  Negative for chest tightness and shortness of breath.        Slight cough with current sinus infection  Cardiovascular:  Negative for chest pain.       Occ fluttering feeling---??acid reflux (doesn't change pulse) Mild ankle puffiness at night--better in the AM  Gastrointestinal:  Negative for blood in stool and constipation.       Reflux controlled with med  Endocrine: Negative for polydipsia and polyuria.  Genitourinary:  Negative for dyspareunia, dysuria and hematuria.  Musculoskeletal:  Negative for back pain and joint swelling.       Some mild arthritis---uses heating pad on back, uses advil at night (prevents AM stiffness)  Skin:  Negative for rash.  Allergic/Immunologic: Positive for environmental allergies. Negative for immunocompromised state.       Uses claritin and zyrtec in season  Neurological:  Positive for headaches. Negative for dizziness, syncope and light-headedness.  Hematological:  Negative for adenopathy. Does not bruise/bleed easily.  Psychiatric/Behavioral:  Negative for dysphoric mood and sleep disturbance. The patient is not nervous/anxious.        Objective:   Physical Exam Constitutional:      Appearance: Normal appearance.  HENT:     Mouth/Throat:     Pharynx: No oropharyngeal exudate or posterior oropharyngeal erythema.  Eyes:     Conjunctiva/sclera: Conjunctivae normal.     Pupils: Pupils are equal, round, and reactive to light.  Cardiovascular:     Rate and Rhythm: Normal rate and regular  rhythm.     Pulses: Normal pulses.     Heart sounds: No murmur heard.    No gallop.  Pulmonary:     Effort: Pulmonary effort is normal.     Breath sounds: Normal breath sounds. No wheezing  or rales.  Abdominal:     Palpations: Abdomen is soft.     Tenderness: There is no abdominal tenderness.  Musculoskeletal:     Cervical back: Neck supple.     Right lower leg: No edema.     Left lower leg: No edema.  Lymphadenopathy:     Cervical: No cervical adenopathy.  Skin:    Findings: No lesion or rash.  Neurological:     General: No focal deficit present.     Mental Status: She is alert and oriented to person, place, and time.  Psychiatric:        Mood and Affect: Mood normal.        Behavior: Behavior normal.            Assessment & Plan:

## 2022-07-25 NOTE — Assessment & Plan Note (Signed)
Healthy Discussed increasing fitness efforts Doesn't want updated COVID vaccine Colon due 2024 Just had mammogram Pap due 2028

## 2022-07-26 ENCOUNTER — Encounter: Payer: Self-pay | Admitting: Internal Medicine

## 2022-07-26 LAB — COMPREHENSIVE METABOLIC PANEL
ALT: 33 IU/L — ABNORMAL HIGH (ref 0–32)
AST: 25 IU/L (ref 0–40)
Albumin/Globulin Ratio: 2.7 — ABNORMAL HIGH (ref 1.2–2.2)
Albumin: 4.8 g/dL (ref 3.8–4.9)
Alkaline Phosphatase: 75 IU/L (ref 44–121)
BUN/Creatinine Ratio: 16 (ref 9–23)
BUN: 14 mg/dL (ref 6–24)
Bilirubin Total: 0.3 mg/dL (ref 0.0–1.2)
CO2: 23 mmol/L (ref 20–29)
Calcium: 9.4 mg/dL (ref 8.7–10.2)
Chloride: 105 mmol/L (ref 96–106)
Creatinine, Ser: 0.85 mg/dL (ref 0.57–1.00)
Globulin, Total: 1.8 g/dL (ref 1.5–4.5)
Glucose: 85 mg/dL (ref 70–99)
Potassium: 3.8 mmol/L (ref 3.5–5.2)
Sodium: 143 mmol/L (ref 134–144)
Total Protein: 6.6 g/dL (ref 6.0–8.5)
eGFR: 80 mL/min/{1.73_m2} (ref >59)

## 2022-07-26 LAB — CBC
Hematocrit: 42.1 % (ref 34.0–46.6)
Hemoglobin: 14.1 g/dL (ref 11.1–15.9)
MCH: 28.6 pg (ref 26.6–33.0)
MCHC: 33.5 g/dL (ref 31.5–35.7)
MCV: 85 fL (ref 79–97)
Platelets: 256 10*3/uL (ref 150–450)
RBC: 4.93 x10E6/uL (ref 3.77–5.28)
RDW: 12.9 % (ref 11.7–15.4)
WBC: 5.5 10*3/uL (ref 3.4–10.8)

## 2022-07-26 LAB — LIPID PANEL
Chol/HDL Ratio: 4.5 ratio — ABNORMAL HIGH (ref 0.0–4.4)
Cholesterol, Total: 275 mg/dL — ABNORMAL HIGH (ref 100–199)
HDL: 61 mg/dL (ref >39)
LDL Chol Calc (NIH): 181 mg/dL — ABNORMAL HIGH (ref 0–99)
Triglycerides: 180 mg/dL — ABNORMAL HIGH (ref 0–149)
VLDL Cholesterol Cal: 33 mg/dL (ref 5–40)

## 2022-08-17 DIAGNOSIS — F4322 Adjustment disorder with anxiety: Secondary | ICD-10-CM | POA: Diagnosis not present

## 2022-09-21 DIAGNOSIS — F4322 Adjustment disorder with anxiety: Secondary | ICD-10-CM | POA: Diagnosis not present

## 2022-09-27 ENCOUNTER — Telehealth (INDEPENDENT_AMBULATORY_CARE_PROVIDER_SITE_OTHER): Payer: BC Managed Care – PPO | Admitting: Family Medicine

## 2022-09-27 ENCOUNTER — Encounter: Payer: Self-pay | Admitting: Family Medicine

## 2022-09-27 VITALS — BP 126/85 | HR 95 | Temp 98.9°F | Ht 59.0 in | Wt 126.0 lb

## 2022-09-27 DIAGNOSIS — U071 COVID-19: Secondary | ICD-10-CM | POA: Diagnosis not present

## 2022-09-27 MED ORDER — NIRMATRELVIR/RITONAVIR (PAXLOVID)TABLET: 3.0000 | ORAL_TABLET | Freq: Two times a day (BID) | ORAL | 0 refills | Status: AC

## 2022-09-27 NOTE — Assessment & Plan Note (Addendum)
Reviewed currently approved antiviral treatments.  Reviewed expected course of illness, anticipated course of recovery, as well as red flags to suggest COVID pneumonia and/or to seek urgent in-person care. Reviewed CDC isolation/quarantine guidelines.  Encouraged fluids and rest. Reviewed further supportive care measures at home including vit C '500mg'$  bid, vit D 2000 IU daily, zinc '100mg'$  daily, tylenol PRN, pepcid '20mg'$  BID PRN.   Recommend:  Supportive measures reviewed. Discussed how she is low risk for complication. Antiviral requested.  Full dose paxlovid Rx sent to pharmacy, reviewing possible side effects. Discussed possible lack of coverage by insurance - if this is the case, provided information on Paxcess online program for pharmaceutical discount.  Paxlovid drug interactions:  omeprazole - mild.

## 2022-09-27 NOTE — Progress Notes (Signed)
Patient ID: Samantha Howell, female    DOB: 1965/12/16, 57 y.o.   MRN: [id number]  Virtual visit completed through [place], a video enabled telemedicine application. Due to national recommendations of social distancing due to COVID-19, a virtual visit is felt to be most appropriate for this patient at this time. Reviewed limitations, risks, security and privacy concerns of performing a virtual visit and the availability of in person appointments. I also reviewed that there may be a patient responsible charge related to this service. The patient agreed to proceed.   Patient location: home Provider location: [place] [facility], office Persons participating in this virtual visit: patient, provider   If any vitals were documented, they were collected by patient at home unless specified below.    BP 126/85   Pulse 95   Temp 98.9 F (37.2 C)   Ht 4' 11"$  (1.499 m)   Wt 126 lb (57.2 kg)   LMP 03/02/2013   SpO2 98%   BMI 25.45 kg/m   BP Readings from Last 3 Encounters:  09/27/22 126/85  07/25/22 130/78  07/13/21 128/80   CC: COVID  Subjective:   HPI: Samantha Howell is a 57 y.o. female presenting on 09/27/2022 for Covid Positive (C/o ST, cough, nasal congestion, HA, fever- max 100.7 and body aches. Sxs started 09/25/22. Pos home Covid test- 09/26/22.)   First day of symptoms 09/25/2022 Tested positive 09/26/2022 Current symptoms: congestion, HA, facial pain and pressure, ear pain, stuffy nose, dry cough, rhinorrhea, body aches, Tmax 100.7. Marked nasal congestion.  No: ear or tooth pain, abd pain, nausea, diarrhea, loss of smell or taste  Treatments to date: humidifier, saline solution, hot compresses. Mucinex/guaifenesin not effective. Flonase caused nose bleed.  Risk factors include: none. h/o recurrent chronic sinusitis  No sick contacts at home.   COVID vaccination status: x3-4     Relevant past medical, surgical, family and social history reviewed and updated as indicated.  Interim medical history since our last visit reviewed. Allergies and medications reviewed and updated. Outpatient Medications Prior to Visit  Medication Sig Dispense Refill   baclofen (LIORESAL) 10 MG tablet Take 10 mg by mouth 2 (two) times daily as needed.     cetirizine (ZYRTEC) 10 MG tablet Take 10 mg by mouth daily.     Cholecalciferol 100 MCG (4000 UT) CAPS Take 1 capsule by mouth daily.     fluticasone (FLONASE) 50 MCG/ACT nasal spray Place 2 sprays into both nostrils daily. 16 g 1   ibuprofen (ADVIL,MOTRIN) 200 MG tablet Take 200 mg by mouth as needed.     Multiple Vitamin (MULTIVITAMIN) tablet Take 1 tablet by mouth daily.     omeprazole (PRILOSEC OTC) 20 MG tablet Take 20 mg by mouth daily.     promethazine (PHENERGAN) 25 MG tablet Take 1 tablet (25 mg total) by mouth every 6 (six) hours as needed for nausea or vomiting. 30 tablet 1   amoxicillin-clavulanate (AUGMENTIN) 875-125 MG tablet Take 1 tablet by mouth 2 (two) times daily. 14 tablet 0   No facility-administered medications prior to visit.     Per HPI unless specifically indicated in ROS section below Review of Systems Objective:  BP 126/85   Pulse 95   Temp 98.9 F (37.2 C)   Ht 4' 11"$  (1.499 m)   Wt 126 lb (57.2 kg)   LMP 03/02/2013   SpO2 98%   BMI 25.45 kg/m   Wt Readings from Last 3 Encounters:  09/27/22 126 lb (  57.2 kg)  07/25/22 124 lb (56.2 kg)  07/13/21 126 lb (57.2 kg)       Physical exam: Gen: alert, NAD, nasal congestion present Pulm: speaks in complete sentences without increased work of breathing Psych: normal mood, normal thought content      Results for orders placed or performed in visit on 07/25/22  Comprehensive metabolic panel  Result Value Ref Range   Glucose 85 70 - 99 mg/dL   BUN 14 6 - 24 mg/dL   Creatinine, Ser 0.85 0.57 - 1.00 mg/dL   eGFR 80 >59 mL/min/1.73   BUN/Creatinine Ratio 16 9 - 23   Sodium 143 134 - 144 mmol/L   Potassium 3.8 3.5 - 5.2 mmol/L   Chloride 105 96 -  106 mmol/L   CO2 23 20 - 29 mmol/L   Calcium 9.4 8.7 - 10.2 mg/dL   Total Protein 6.6 6.0 - 8.5 g/dL   Albumin 4.8 3.8 - 4.9 g/dL   Globulin, Total 1.8 1.5 - 4.5 g/dL   Albumin/Globulin Ratio 2.7 (H) 1.2 - 2.2   Bilirubin Total 0.3 0.0 - 1.2 mg/dL   Alkaline Phosphatase 75 44 - 121 IU/L   AST 25 0 - 40 IU/L   ALT 33 (H) 0 - 32 IU/L  CBC  Result Value Ref Range   WBC 5.5 3.4 - 10.8 x10E3/uL   RBC 4.93 3.77 - 5.28 x10E6/uL   Hemoglobin 14.1 11.1 - 15.9 g/dL   Hematocrit 42.1 34.0 - 46.6 %   MCV 85 79 - 97 fL   MCH 28.6 26.6 - 33.0 pg   MCHC 33.5 31.5 - 35.7 g/dL   RDW 12.9 11.7 - 15.4 %   Platelets 256 150 - 450 x10E3/uL  Lipid panel  Result Value Ref Range   Cholesterol, Total 275 (H) 100 - 199 mg/dL   Triglycerides 180 (H) 0 - 149 mg/dL   HDL 61 >39 mg/dL   VLDL Cholesterol Cal 33 5 - 40 mg/dL   LDL Chol Calc (NIH) 181 (H) 0 - 99 mg/dL   Chol/HDL Ratio 4.5 (H) 0.0 - 4.4 ratio   Assessment & Plan:   COVID-19 virus infection Assessment & Plan: Reviewed currently approved antiviral treatments.  Reviewed expected course of illness, anticipated course of recovery, as well as red flags to suggest COVID pneumonia and/or to seek urgent in-person care. Reviewed CDC isolation/quarantine guidelines.  Encouraged fluids and rest. Reviewed further supportive care measures at home including vit C 58m bid, vit D 2000 IU daily, zinc 1058mdaily, tylenol PRN, pepcid 2057mID PRN.   Recommend:  Supportive measures reviewed. Discussed how she is low risk for complication. Antiviral requested.  Full dose paxlovid Rx sent to pharmacy, reviewing possible side effects. Discussed possible lack of coverage by insurance - if this is the case, provided information on Paxcess online program for pharmaceutical discount.  Paxlovid drug interactions:  omeprazole - mild.    Other orders -     nirmatrelvir/ritonavir; Take 3 tablets by mouth 2 (two) times daily for 5 days. (Take nirmatrelvir 150 mg two  tablets twice daily for 5 days and ritonavir 100 mg one tablet twice daily for 5 days) Patient GFR is 80  Dispense: 30 tablet; Refill: 0     I discussed the assessment and treatment plan with the patient. The patient was provided an opportunity to ask questions and all were answered. The patient agreed with the plan and demonstrated an understanding of the instructions. The patient was advised to  call back or seek an in-person evaluation if the symptoms worsen or if the condition fails to improve as anticipated.  Follow up plan: No follow-ups on file.  Ria Bush, MD

## 2022-09-29 ENCOUNTER — Telehealth: Payer: Self-pay | Admitting: Internal Medicine

## 2022-09-29 NOTE — Telephone Encounter (Signed)
Pt called stating she was experiencing dizziness & nausea from the meds, nirmatrelvir/ritonavir (PAXLOVID) 20 x 150 MG & 10 x '100MG'$  TABS. Pt mentioned she'd like to stop the meds due to the side effects. Transferred pt to access nurse. Call back # [phone]

## 2022-09-29 NOTE — Telephone Encounter (Signed)
Aware, agree with advisement

## 2022-09-29 NOTE — Telephone Encounter (Signed)
I spoke with pt;pt said pt does not have fever now, no body aches and generally pt feels better. Pt still has dry cough,congestion in head and slight H/A.pt wanted to know if could stop paxlovid to see if that was causing the nausea and dizziness.pt said access nurse had advised pt she could stop paxlovid and see how she did with UC & ED precautions. Pt said that she will cb on 10/02/22 with update on how she is feeling and if condition worsens pt will go to uc or ED. Sending note to Dr Silvio Pate who is out of office and Dr Glori Bickers who is in office and [place] pool.      [facility] - Client TELEPHONE ADVICE RECORD Access[facility]urse Patient Name: Samantha Howell Gender: Female DOB: 1966-06-05 Age: 79 Y 36 M 27 D Return Phone Number: [phone] (Primary) Address: City/ State/ Zip: [place]  [address] Client [facility] - Client Client Site [facility] - Day Provider Viviana Simpler- MD Contact Type Call Who Is Calling Patient / Member / Family / Caregiver Call Type Triage / Clinical Relationship To Patient Self Return Phone Number [phone] (Primary) Chief Complaint Dizziness Reason for Call Symptomatic / Request for [name] states she is on Paxlovid and is experiencing some dizziness and nausea. Translation No Nurse Assessment Nurse: Samantha Sleet, RN, Erasmo Downer Date/Time (Eastern Time): 09/29/2022 1:35:18 PM Confirm and document reason for call. If symptomatic, describe symptoms. ---Caller states that she has been on Paxlovid for four days, started having some dizziness and nausea. Does the patient have any new or worsening symptoms? ---Yes Will a triage be completed? ---Yes Related visit to physician within the last 2 weeks? ---Yes Does the PT have any chronic conditions? (i.e. diabetes, asthma, this includes High risk factors for pregnancy, etc.) ---No Is this a behavioral  health or substance abuse call? ---No Guidelines Guideline Title Affirmed Question Affirmed Notes Nurse Date/Time (Eastern Time) Dizziness - Lightheadedness Taking a medicine that could cause dizziness (e.g., blood pressure medications, diuretics) Donadeo, RN, Erasmo Downer 09/29/2022 1:36:00 PM Disp. Time Eilene Ghazi Time) Disposition Final User 09/29/2022 1:40:17 PM See PCP within 24 Hours Yes Samantha Sleet, RN, Erasmo Downer Final Disposition 09/29/2022 1:40:17 PM See PCP within 24 Hours Yes Donadeo, RN, Erasmo Downer PLEASE NOTE: All timestamps contained within this report are represented as [place] Standard Time. CONFIDENTIALTY NOTICE: This fax transmission is intended only for the addressee. It contains information that is legally privileged, confidential or otherwise protected from use or disclosure. If you are not the intended recipient, you are strictly prohibited from reviewing, disclosing, copying using or disseminating any of this information or taking any action in reliance on or regarding this information. If you have received this fax in error, please notify us immediately by telephone so that we can arrange for its return to [place]. Phone: [phone], Toll-Free: [phone], Fax: [phone] Page: 2 of 2 Call Id: [id number] [place] Disagree/Comply Comply Caller Understands Yes PreDisposition Call Doctor Care Advice Given Per Guideline SEE PCP WITHIN 24 HOURS: * IF OFFICE WILL BE OPEN: You need to be examined within the next 24 hours. Call your doctor (or NP/PA) when the office opens and make an appointment. CALL BACK IF: * You become worse CARE ADVICE given per Dizziness (Adult) guideline. MEDICINE AS A CAUSE: * Your medicine may or may not be causing your dizziness. Your doctor can help you decide. Referrals REFERRED TO PCP OFFIC

## 2022-10-02 NOTE — Telephone Encounter (Signed)
Spoke to pt. She said she is feeling so much better since she stopped taking the Paxlovid.

## 2022-10-19 DIAGNOSIS — F4322 Adjustment disorder with anxiety: Secondary | ICD-10-CM | POA: Diagnosis not present

## 2022-11-23 DIAGNOSIS — F4322 Adjustment disorder with anxiety: Secondary | ICD-10-CM | POA: Diagnosis not present

## 2022-12-26 DIAGNOSIS — H9201 Otalgia, right ear: Secondary | ICD-10-CM | POA: Diagnosis not present

## 2023-01-11 DIAGNOSIS — F4322 Adjustment disorder with anxiety: Secondary | ICD-10-CM | POA: Diagnosis not present

## 2023-03-12 DIAGNOSIS — F411 Generalized anxiety disorder: Secondary | ICD-10-CM | POA: Diagnosis not present

## 2023-04-19 DIAGNOSIS — F411 Generalized anxiety disorder: Secondary | ICD-10-CM | POA: Diagnosis not present

## 2023-06-18 DIAGNOSIS — F411 Generalized anxiety disorder: Secondary | ICD-10-CM | POA: Diagnosis not present

## 2023-07-10 IMAGING — MG MM DIGITAL SCREENING BILAT W/ TOMO AND CAD
8 series · 8 of 24 positions shown · non-contrast
Comparison: Previous exam(s).

CLINICAL DATA: Screening.

EXAM:
DIGITAL SCREENING BILATERAL MAMMOGRAM WITH TOMOSYNTHESIS AND CAD
TECHNIQUE: Bilateral screening digital craniocaudal and mediolateral oblique
mammograms were obtained. Bilateral screening digital breast
tomosynthesis was performed. The images were evaluated with
computer-aided detection.

[R MLO synth-2D]
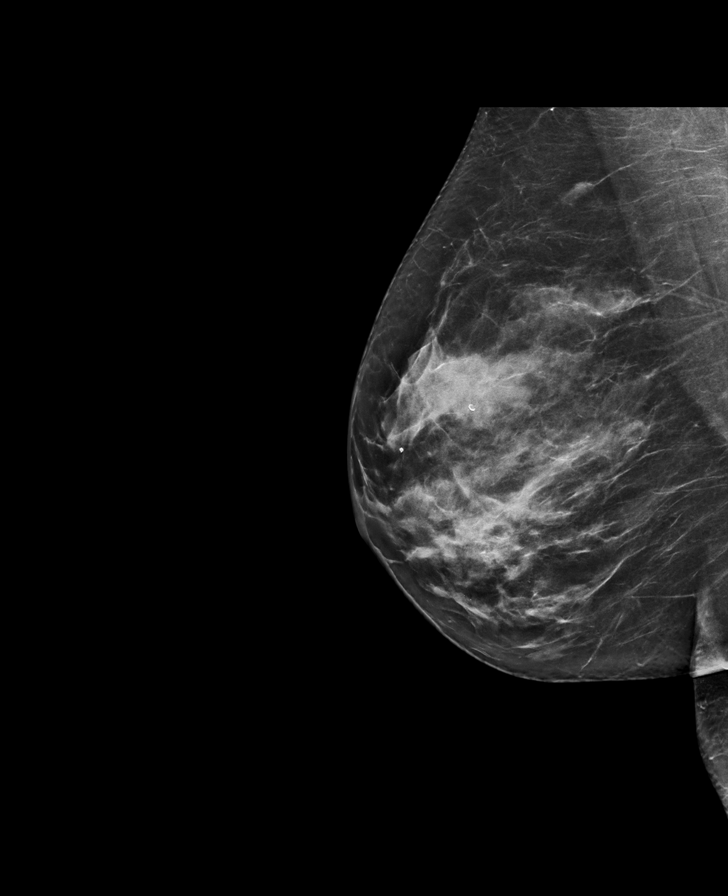

[R CC synth-2D]
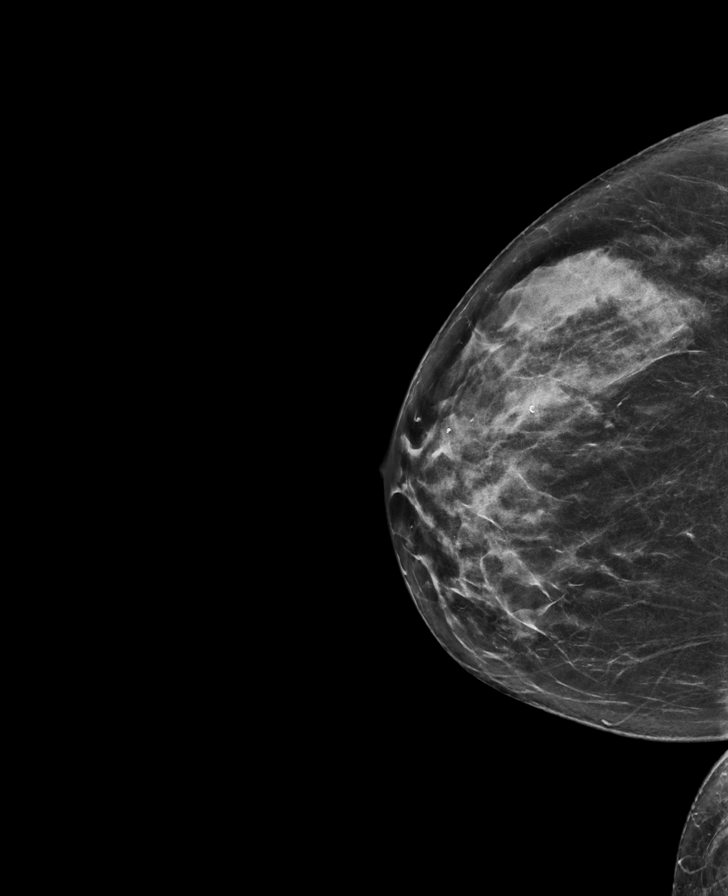

[L MLO synth-2D]
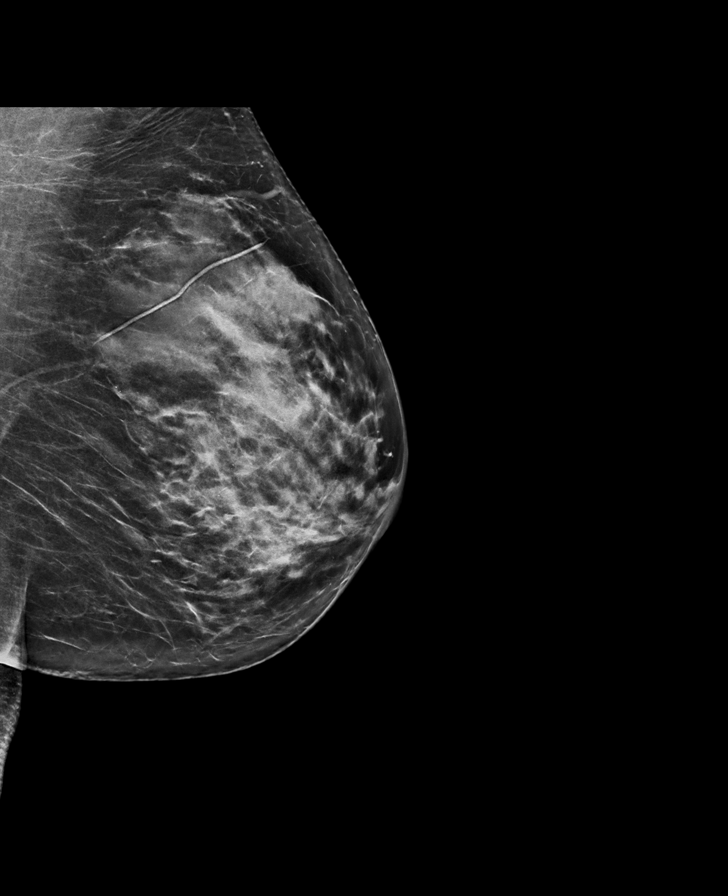

[L CC synth-2D]
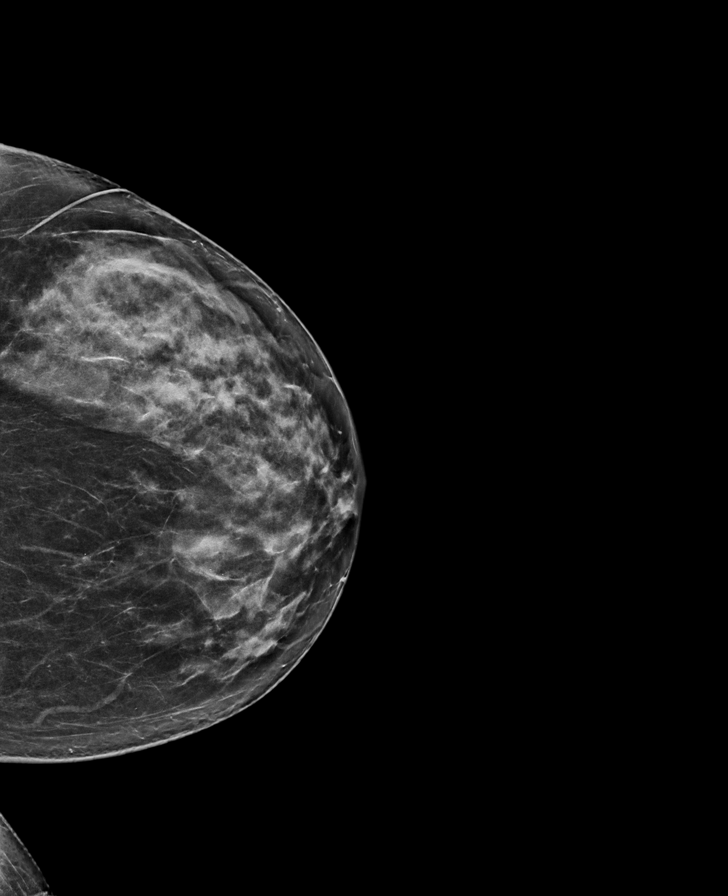

[L CC tomo · tomo slice 35/69.0]
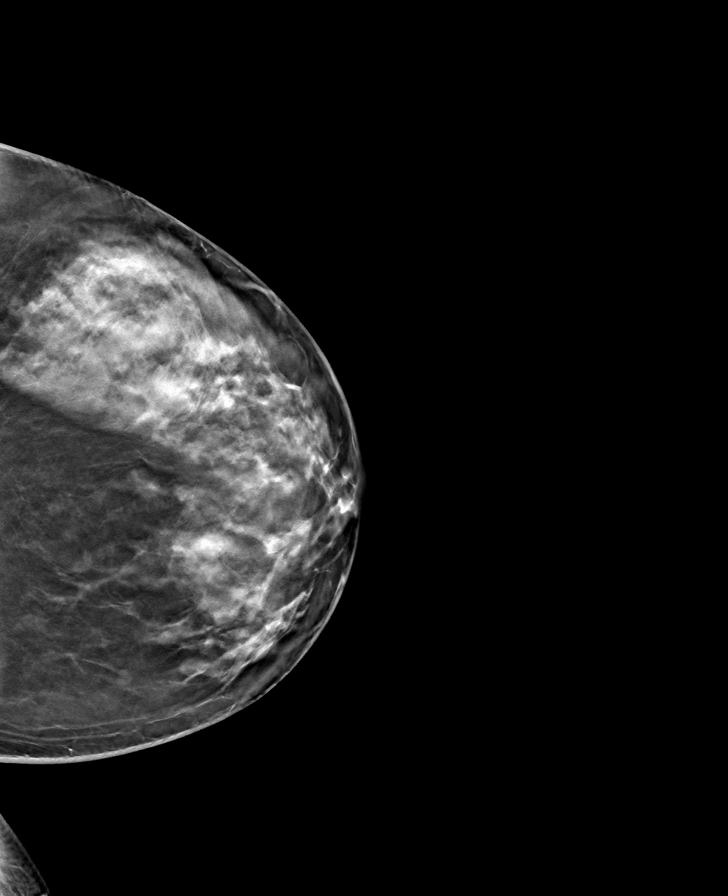

[R MLO tomo · tomo slice 37/74.0]
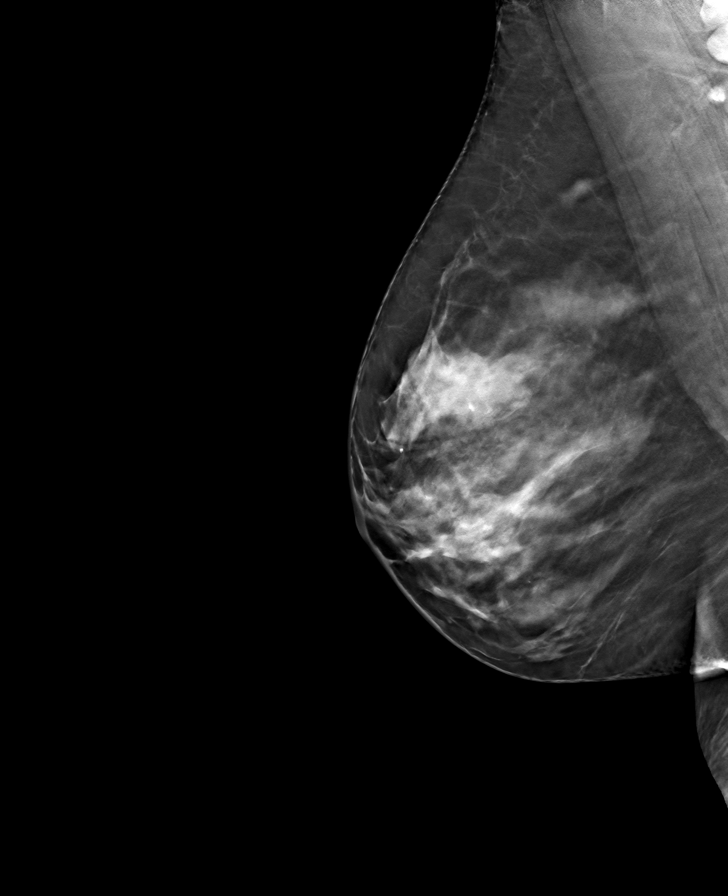

[R CC tomo · tomo slice 35/69.0]
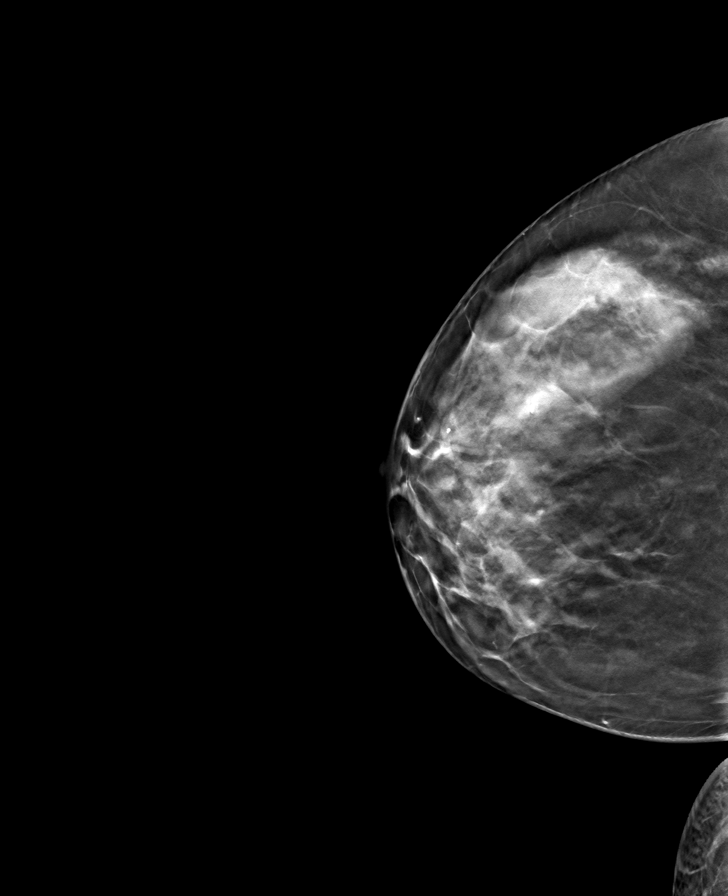

[L MLO tomo · tomo slice 37/72.0]
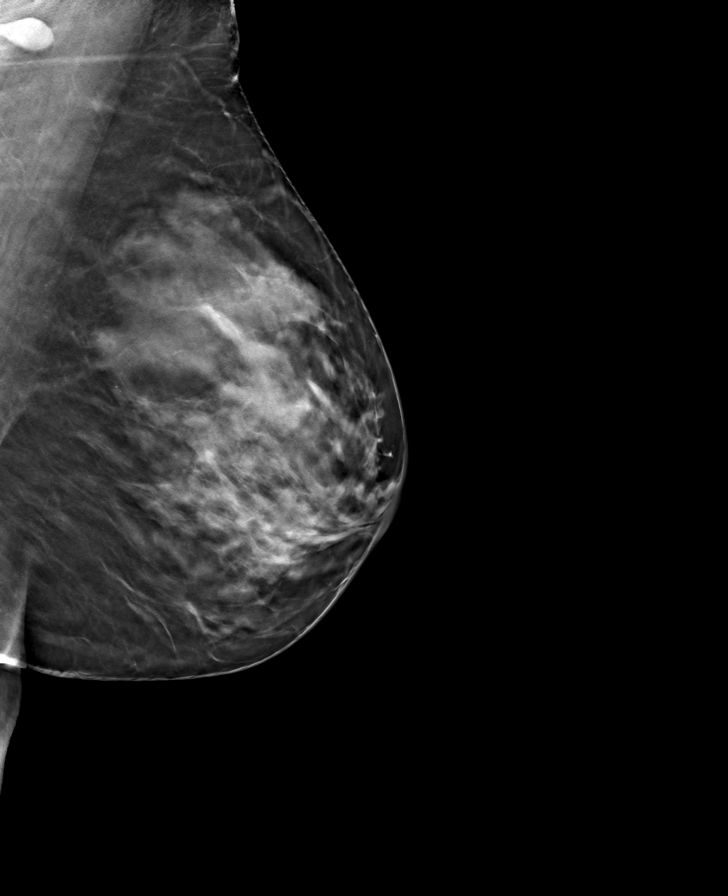

[8 of 24 positions shown; findings below may reference images not displayed]

ACR Breast Density Category c: The breast tissue is heterogeneously
dense, which may obscure small masses.
FINDINGS: There are no findings suspicious for malignancy.
IMPRESSION: No mammographic evidence of malignancy. A result letter of this
screening mammogram will be mailed directly to the patient.

RECOMMENDATION:
Screening mammogram in one year. (Code:[id number])

BI-RADS CATEGORY  1: Negative.

## 2023-07-17 DIAGNOSIS — L821 Other seborrheic keratosis: Secondary | ICD-10-CM | POA: Diagnosis not present

## 2023-07-17 DIAGNOSIS — L578 Other skin changes due to chronic exposure to nonionizing radiation: Secondary | ICD-10-CM | POA: Diagnosis not present

## 2023-07-17 DIAGNOSIS — X32XXXA Exposure to sunlight, initial encounter: Secondary | ICD-10-CM | POA: Diagnosis not present

## 2023-07-17 DIAGNOSIS — D2372 Other benign neoplasm of skin of left lower limb, including hip: Secondary | ICD-10-CM | POA: Diagnosis not present

## 2023-07-27 ENCOUNTER — Encounter: Payer: Self-pay | Admitting: Internal Medicine

## 2023-07-27 ENCOUNTER — Ambulatory Visit (INDEPENDENT_AMBULATORY_CARE_PROVIDER_SITE_OTHER): Payer: BC Managed Care – PPO | Admitting: Internal Medicine

## 2023-07-27 ENCOUNTER — Other Ambulatory Visit: Payer: Self-pay | Admitting: Internal Medicine

## 2023-07-27 VITALS — BP 122/80 | HR 60 | Temp 98.5°F | Ht 59.0 in | Wt 127.0 lb

## 2023-07-27 DIAGNOSIS — E785 Hyperlipidemia, unspecified: Secondary | ICD-10-CM

## 2023-07-27 DIAGNOSIS — Z0000 Encounter for general adult medical examination without abnormal findings: Secondary | ICD-10-CM | POA: Diagnosis not present

## 2023-07-27 DIAGNOSIS — K219 Gastro-esophageal reflux disease without esophagitis: Secondary | ICD-10-CM | POA: Diagnosis not present

## 2023-07-27 DIAGNOSIS — Z23 Encounter for immunization: Secondary | ICD-10-CM | POA: Diagnosis not present

## 2023-07-27 DIAGNOSIS — Z1231 Encounter for screening mammogram for malignant neoplasm of breast: Secondary | ICD-10-CM

## 2023-07-27 NOTE — Assessment & Plan Note (Signed)
Healthy but needs to work on fitness Mammogram scheduled soon Pap due 2027 Colonoscopy due now--will check with Dr Lavon Paganini Td today Had flu vaccine Still prefers no COVID updates

## 2023-07-27 NOTE — Assessment & Plan Note (Signed)
Does okay with daily omerprazole 20

## 2023-07-27 NOTE — Addendum Note (Signed)
Addended by: Eual Fines on: 07/27/2023 12:53 PM   Modules accepted: Orders

## 2023-07-27 NOTE — Progress Notes (Signed)
Subjective:    Patient ID: Samantha Howell, female    DOB: 1965/09/09, 57 y.o.   MRN: [id number]  HPI Here for physical  Doing okay The ear pain is better----neurologist feels it was from TMJ Using mouth guard and exercises  Not really exercising lately--had started the year strong, and now has slacked off (mom had gotten sick, etc)  Does monitor blood pressure at home 130/89 was the highest  Mostly lower than that--one 96/71 but heart rate 133 (was at car wash and felt faint)   Current Outpatient Medications on File Prior to Visit  Medication Sig Dispense Refill   cetirizine (ZYRTEC) 10 MG tablet Take 10 mg by mouth daily.     Cholecalciferol 100 MCG (4000 UT) CAPS Take 1 capsule by mouth daily.     fluticasone (FLONASE) 50 MCG/ACT nasal spray Place 2 sprays into both nostrils daily. 16 g 1   ibuprofen (ADVIL,MOTRIN) 200 MG tablet Take 200 mg by mouth as needed.     Multiple Vitamin (MULTIVITAMIN) tablet Take 1 tablet by mouth daily.     omeprazole (PRILOSEC OTC) 20 MG tablet Take 20 mg by mouth daily.     promethazine (PHENERGAN) 25 MG tablet Take 1 tablet (25 mg total) by mouth every 6 (six) hours as needed for nausea or vomiting. 30 tablet 1   No current facility-administered medications on file prior to visit.    Allergies  Allergen Reactions   Sulfonamide Derivatives Hives    Happened when she was a child.    Past Medical History:  Diagnosis Date   Abdominal pain    Allergic rhinitis    Allergy    Anemia    history   Fibrocystic breast    GERD (gastroesophageal reflux disease)    HLD (hyperlipidemia)    diet controlled, no meds   Migraines    Nausea    SVD (spontaneous vaginal delivery)    x 2    Past Surgical History:  Procedure Laterality Date   BREAST EXCISIONAL BIOPSY Left 2001   benign   BREAST LUMPECTOMY  12/00   Left breast   CHOLECYSTECTOMY  06/13/2011   Procedure: LAPAROSCOPIC CHOLECYSTECTOMY WITH INTRAOPERATIVE CHOLANGIOGRAM;  Surgeon:  Ernestene Mention, MD;  Location: [facility];  Service: General;  Laterality: N/A;   TUBAL LIGATION  1992   WISDOM TOOTH EXTRACTION      Family History  Problem Relation Age of Onset   Heart attack Father    Hyperlipidemia Father    Hearing loss Father    Atrial fibrillation Father    Stroke Father    Early menopause Mother    Arthritis Mother    Ovarian cancer Paternal Aunt    Cancer Paternal Aunt        ovarian   Cancer Maternal Grandmother        Bladder, breast   Breast cancer Other        MGGM   Crohn's disease Sister    Ulcerative colitis Sister    Colon cancer Neg Hx    Esophageal cancer Neg Hx    Rectal cancer Neg Hx    Stomach cancer Neg Hx     Social History   Socioeconomic History   Marital status: Married    Spouse name: Not on file   Number of children: 2   Years of education: Not on file   Highest education level: Not on file  Occupational History   Occupation: Primary school teacher: [employer]  Tobacco Use   Smoking status: Former    Current packs/day: 0.00    Average packs/day: 0.3 packs/day for 4.0 years (1.0 ttl pk-yrs)    Types: Cigarettes    Start date: 07/31/1992    Quit date: 07/31/1996    Years since quitting: 27.0    Passive exposure: Never   Smokeless tobacco: Never  Substance and Sexual Activity   Alcohol use: Yes    Alcohol/week: 0.0 standard drinks of alcohol    Comment: Occasional beer   Drug use: No   Sexual activity: Not on file  Other Topics Concern   Not on file  Social History Narrative   IT department (contracts) for [employer]      Married; 2 children      Social Drivers of Corporate investment banker Strain: Not on file  Food Insecurity: Not on file  Transportation Needs: Not on file  Physical Activity: Not on file  Stress: Not on file  Social Connections: Not on file  Intimate Partner Violence: Not on file   Review of Systems  Constitutional:  Negative for fatigue and unexpected weight change.       Wears seat belt    HENT:  Negative for dental problem, hearing loss, tinnitus and trouble swallowing.   Eyes:  Negative for visual disturbance.       No diplopia or unilateral vision loss  Respiratory:  Negative for cough, chest tightness and shortness of breath.   Cardiovascular:  Negative for chest pain and leg swelling.       Occ flutter---no racing  Gastrointestinal:  Negative for blood in stool and constipation.       No heartburn --with the omeprazole. Rarely takes pepcid complete for breakthrough  Endocrine: Negative for polydipsia and polyuria.  Genitourinary:  Negative for dysuria and hematuria.       Uses lubricant for sex  Musculoskeletal:  Negative for joint swelling.       Gets some leg pains--knee--and from back Plans to see podiatrist--has plantar fibroma--discussed going back to Dr Patsy Lager about this Brief intense pain--does wear high quality shoes  Skin:  Negative for rash.       Just had derm visit  Allergic/Immunologic: Positive for environmental allergies. Negative for immunocompromised state.       Satisfied with control---adding benedryl at night helps  Neurological:  Negative for dizziness, syncope, light-headedness and headaches.  Hematological:  Negative for adenopathy. Does not bruise/bleed easily.  Psychiatric/Behavioral:  Negative for decreased concentration and sleep disturbance. The patient is not nervous/anxious.        Objective:   Physical Exam Constitutional:      Appearance: Normal appearance.  HENT:     Mouth/Throat:     Pharynx: No oropharyngeal exudate or posterior oropharyngeal erythema.  Eyes:     Conjunctiva/sclera: Conjunctivae normal.     Pupils: Pupils are equal, round, and reactive to light.  Cardiovascular:     Rate and Rhythm: Normal rate and regular rhythm.     Pulses: Normal pulses.     Heart sounds: No murmur heard.    No gallop.  Pulmonary:     Effort: Pulmonary effort is normal.     Breath sounds: Normal breath sounds. No wheezing or rales.   Abdominal:     Palpations: Abdomen is soft.     Tenderness: There is no abdominal tenderness.  Musculoskeletal:     Cervical back: Neck supple.     Right lower leg: No edema.  Left lower leg: No edema.  Lymphadenopathy:     Cervical: No cervical adenopathy.  Skin:    Findings: No rash.  Neurological:     General: No focal deficit present.     Mental Status: She is alert and oriented to person, place, and time.  Psychiatric:        Mood and Affect: Mood normal.        Behavior: Behavior normal.            Assessment & Plan:

## 2023-07-27 NOTE — Assessment & Plan Note (Signed)
Still prefers no primary prevention with statin Will still start one if LDL over 190

## 2023-07-28 LAB — COMPREHENSIVE METABOLIC PANEL
ALT: 15 [IU]/L (ref 0–32)
AST: 16 [IU]/L (ref 0–40)
Albumin: 4.3 g/dL (ref 3.8–4.9)
Alkaline Phosphatase: 69 [IU]/L (ref 44–121)
BUN/Creatinine Ratio: 12 (ref 9–23)
BUN: 9 mg/dL (ref 6–24)
Bilirubin Total: 0.2 mg/dL (ref 0.0–1.2)
CO2: 23 mmol/L (ref 20–29)
Calcium: 9.4 mg/dL (ref 8.7–10.2)
Chloride: 105 mmol/L (ref 96–106)
Creatinine, Ser: 0.75 mg/dL (ref 0.57–1.00)
Globulin, Total: 2 g/dL (ref 1.5–4.5)
Glucose: 88 mg/dL (ref 70–99)
Potassium: 3.7 mmol/L (ref 3.5–5.2)
Sodium: 141 mmol/L (ref 134–144)
Total Protein: 6.3 g/dL (ref 6.0–8.5)
eGFR: 92 mL/min/{1.73_m2} (ref >59)

## 2023-07-28 LAB — LIPID PANEL
Chol/HDL Ratio: 4 {ratio} (ref 0.0–4.4)
Cholesterol, Total: 242 mg/dL — ABNORMAL HIGH (ref 100–199)
HDL: 60 mg/dL (ref >39)
LDL Chol Calc (NIH): 160 mg/dL — ABNORMAL HIGH (ref 0–99)
Triglycerides: 123 mg/dL (ref 0–149)
VLDL Cholesterol Cal: 22 mg/dL (ref 5–40)

## 2023-07-28 LAB — CBC
Hematocrit: 41.4 % (ref 34.0–46.6)
Hemoglobin: 13.4 g/dL (ref 11.1–15.9)
MCH: 28.8 pg (ref 26.6–33.0)
MCHC: 32.4 g/dL (ref 31.5–35.7)
MCV: 89 fL (ref 79–97)
Platelets: 229 10*3/uL (ref 150–450)
RBC: 4.65 x10E6/uL (ref 3.77–5.28)
RDW: 13.2 % (ref 11.7–15.4)
WBC: 5.4 10*3/uL (ref 3.4–10.8)

## 2023-07-30 ENCOUNTER — Encounter: Payer: Self-pay | Admitting: Internal Medicine

## 2023-07-30 ENCOUNTER — Encounter: Payer: BC Managed Care – PPO | Admitting: Internal Medicine

## 2023-08-01 NOTE — Progress Notes (Signed)
 Aryan Bello T. Haruna Rohlfs, MD, CAQ Sports Medicine [facility] Samantha Howell [address] [place], [address]  Phone: [phone]  FAX: [phone]  Samantha Howell - 58 y.o. female  MRN [id number]  Date of Birth: Apr 27, 1966  Date: 08/02/2023  PCP: Jimmy Charlie FERNS, MD  Referral: Jimmy Charlie FERNS, MD  Chief Complaint  Patient presents with   Foot Pain    Bilateral   Subjective:   Samantha Howell is a 58 y.o. very pleasant female patient with Body mass index is 25.45 kg/m. who presents with the following:  The patient is here to discuss bilateral foot pain with me.  She has really been having some pain off and on in the foot for a couple of years that waxes and wanes.  She does have known plantar fibromas on the left longitudinal arch.  These do hurt at times also and with compression.  She predominantly is having pain at the end of her toes in and about the second and third MT P's as well as the phalanges and to a lesser extent on the first ray.  No trauma or injury.  She does think that when she was a little bit more active when it was warmer than her pain was not quite as severe and less often.  Pain at night.  Will feel like pins at the end of her toes.  Will shoot through the bottom of her foot.  Some pain around the arch.   Intermittent.  Going on walks at least three times a week previously.  Wear Brooks.   Review of Systems is noted in the HPI, as appropriate  Objective:   BP (!) 130/90 (BP Location: Right Arm, Patient Position: Sitting, Cuff Size: Normal)   Pulse 76   Temp (!) 97.3 F (36.3 C) (Temporal)   Ht 4' 11 (1.499 m)   Wt 126 lb (57.2 kg)   LMP 03/02/2013   SpO2 98%   BMI 25.45 kg/m   GEN: No acute distress; alert,appropriate. PULM: Breathing comfortably in no respiratory distress PSYCH: Normally interactive.    FEET: B Echymosis: no Edema: no ROM: full LE B Gait: heel toe, non-antalgic MT pain: no Callus pattern:  none Lateral Mall: NT Medial Mall: NT Talus: NT Navicular: NT Cuboid: NT Calcaneous: NT Metatarsals: NT 5th MT: NT Phalanges: NT Achilles: NT Plantar Fascia: NT Fat Pad: NT Peroneals: NT Post Tib: NT Great Toe: Nml motion Ant Drawer: neg ATFL: NT CFL: NT Deltoid: NT Other foot breakdown: none Long arch: preserved Transverse arch: preserved Hindfoot breakdown: none Sensation: intact   Laboratory and Imaging Data:  Assessment and Plan:     ICD-10-CM   1. Morton's metatarsalgia, neuralgia, or neuroma, bilateral  G57.63     2. Bilateral foot pain  M79.671    M79.672      I think clinically this is most likely neuroma formation around second and third metatarsals, possibly first and second metatarsals.  I think that wearing a shoe or orthotic with some transverse arch support will likely help.  She did have decreased symptoms when she was exercising more, so I encouraged her to exercise and walk more.  If they become more problematic, we can always do a trial of a Morton's neuroma injection to see if this helps with her symptoms.   Disposition: prn  Dragon [employer] One speech-to-text software was used for transcription in this dictation.  Possible transcriptional errors can occur using Animal nutritionist.  Signed,  Jacques DASEN. Samuele Storey, MD   Outpatient Encounter Medications as of 08/02/2023  Medication Sig   cetirizine (ZYRTEC) 10 MG tablet Take 10 mg by mouth daily.   Cholecalciferol 100 MCG (4000 UT) CAPS Take 1 capsule by mouth daily.   fluticasone  (FLONASE ) 50 MCG/ACT nasal spray Place 2 sprays into both nostrils daily.   ibuprofen (ADVIL,MOTRIN) 200 MG tablet Take 200 mg by mouth as needed.   Multiple Vitamin (MULTIVITAMIN) tablet Take 1 tablet by mouth daily.   omeprazole (PRILOSEC OTC) 20 MG tablet Take 20 mg by mouth daily.   promethazine  (PHENERGAN ) 25 MG tablet Take 1 tablet (25 mg total) by mouth every 6 (six) hours as needed for nausea or vomiting.    No facility-administered encounter medications on file as of 08/02/2023.

## 2023-08-02 ENCOUNTER — Encounter: Payer: Self-pay | Admitting: Family Medicine

## 2023-08-02 ENCOUNTER — Ambulatory Visit: Payer: BC Managed Care – PPO | Admitting: Family Medicine

## 2023-08-02 VITALS — BP 130/90 | HR 76 | Temp 97.3°F | Ht 59.0 in | Wt 126.0 lb

## 2023-08-02 DIAGNOSIS — M79671 Pain in right foot: Secondary | ICD-10-CM | POA: Diagnosis not present

## 2023-08-02 DIAGNOSIS — G5763 Lesion of plantar nerve, bilateral lower limbs: Secondary | ICD-10-CM | POA: Diagnosis not present

## 2023-08-02 DIAGNOSIS — M79672 Pain in left foot: Secondary | ICD-10-CM | POA: Diagnosis not present

## 2023-08-06 DIAGNOSIS — F411 Generalized anxiety disorder: Secondary | ICD-10-CM | POA: Diagnosis not present

## 2023-08-16 ENCOUNTER — Ambulatory Visit
Admission: RE | Admit: 2023-08-16 | Discharge: 2023-08-16 | Disposition: A | Discharge status: Home / Self Care | Payer: BC Managed Care – PPO | Source: Ambulatory Visit | Attending: Internal Medicine | Admitting: Internal Medicine

## 2023-08-16 DIAGNOSIS — Z1231 Encounter for screening mammogram for malignant neoplasm of breast: Secondary | ICD-10-CM

## 2023-08-17 ENCOUNTER — Encounter: Payer: Self-pay | Admitting: Internal Medicine

## 2023-09-17 DIAGNOSIS — F411 Generalized anxiety disorder: Secondary | ICD-10-CM | POA: Diagnosis not present

## 2023-10-29 DIAGNOSIS — F411 Generalized anxiety disorder: Secondary | ICD-10-CM | POA: Diagnosis not present

## 2023-12-17 DIAGNOSIS — F411 Generalized anxiety disorder: Secondary | ICD-10-CM | POA: Diagnosis not present

## 2024-02-22 ENCOUNTER — Other Ambulatory Visit: Payer: Self-pay | Admitting: Internal Medicine

## 2024-02-22 ENCOUNTER — Encounter: Payer: Self-pay | Admitting: Internal Medicine

## 2024-04-07 ENCOUNTER — Encounter: Payer: Self-pay | Admitting: Gastroenterology

## 2024-04-21 DIAGNOSIS — D3132 Benign neoplasm of left choroid: Secondary | ICD-10-CM | POA: Diagnosis not present

## 2024-04-21 DIAGNOSIS — H2513 Age-related nuclear cataract, bilateral: Secondary | ICD-10-CM | POA: Diagnosis not present

## 2024-04-21 DIAGNOSIS — H52223 Regular astigmatism, bilateral: Secondary | ICD-10-CM | POA: Diagnosis not present

## 2024-04-21 DIAGNOSIS — H5213 Myopia, bilateral: Secondary | ICD-10-CM | POA: Diagnosis not present

## 2024-04-22 DIAGNOSIS — F411 Generalized anxiety disorder: Secondary | ICD-10-CM | POA: Diagnosis not present

## 2024-05-15 ENCOUNTER — Ambulatory Visit (AMBULATORY_SURGERY_CENTER): Admitting: *Deleted

## 2024-05-15 VITALS — Ht 59.0 in | Wt 126.0 lb

## 2024-05-15 DIAGNOSIS — Z860100 Personal history of colon polyps, unspecified: Secondary | ICD-10-CM

## 2024-05-15 MED ORDER — NA SULFATE-K SULFATE-MG SULF 17.5-3.13-1.6 GM/177ML PO SOLN: 1.0000 | Freq: Once | ORAL | 0 refills | Status: AC

## 2024-05-15 NOTE — Progress Notes (Signed)
 Pt's name and DOB verified at the beginning of the pre-visit with 2 identifiers  Pt denies any difficulty with ambulating,sitting, laying down or rolling side to side  Pt has no issues moving head neck or swallowing  No egg or soy allergy known to patient   No issues known to pt with past sedation  No FH of Malignant Hyperthermia  Pt is not on home 02   Pt is not on blood thinners   Pt denies issues with constipation   Pt is not on dialysis  Pt denise any abnormal heart rhythms   Pt denies any upcoming cardiac testing  Patient's chart reviewed by Norleen Schillings CNRA prior to pre-visit and patient appropriate for the [facility].  Pre-visit completed and red dot placed by patient's name on their procedure day (on provider's schedule).    Visit by phone  Pt states weight is 126 lb   Pt given  both [facility] main # and MD on call # prior to instructions.  Informed pt to come in at the time discussed and is shown on PV instructions.  Pt instructed to use Singlecare.com or GoodRx for a price reduction on prep  Instructed pt where to find PV instructions in My Ch. Copy of instructions  to be sent in mail and address read back to pt to verify correct on envelope. Instructed pt on all aspects of written instructions including med holds clothing to wear and foods to eat and not eat as well as after procedure legal restrictions and to call MD on call if needed.. Pt states understanding. Instructed pt to review instructions again prior to procedure and call main # given if has any questions or any issues. Pt states they will.

## 2024-05-21 ENCOUNTER — Encounter: Payer: Self-pay | Admitting: Gastroenterology

## 2024-05-29 ENCOUNTER — Encounter: Payer: Self-pay | Admitting: Gastroenterology

## 2024-05-29 ENCOUNTER — Ambulatory Visit: Admitting: Gastroenterology

## 2024-05-29 VITALS — BP 119/78 | HR 75 | Temp 98.3°F | Resp 16 | Ht 59.0 in | Wt 126.0 lb

## 2024-05-29 DIAGNOSIS — K644 Residual hemorrhoidal skin tags: Secondary | ICD-10-CM | POA: Diagnosis not present

## 2024-05-29 DIAGNOSIS — Z860101 Personal history of adenomatous and serrated colon polyps: Secondary | ICD-10-CM | POA: Diagnosis not present

## 2024-05-29 DIAGNOSIS — D122 Benign neoplasm of ascending colon: Secondary | ICD-10-CM

## 2024-05-29 DIAGNOSIS — K648 Other hemorrhoids: Secondary | ICD-10-CM | POA: Diagnosis not present

## 2024-05-29 DIAGNOSIS — Z1211 Encounter for screening for malignant neoplasm of colon: Secondary | ICD-10-CM | POA: Diagnosis not present

## 2024-05-29 DIAGNOSIS — Z860100 Personal history of colon polyps, unspecified: Secondary | ICD-10-CM

## 2024-05-29 MED ORDER — SODIUM CHLORIDE 0.9 % IV SOLN: 500.0000 mL | Freq: Once | INTRAVENOUS | Status: DC

## 2024-05-29 NOTE — Op Note (Signed)
 [facility] Patient Name: Samantha Howell Procedure Date: 05/29/2024 10:12 AM MRN: [id number] Endoscopist: Gustav ALONSO Mcgee , MD, [id number] Age: 58 Referring MD:  Date of Birth: Apr 09, 1966 Gender: Female Account #: [id number] Procedure:                Colonoscopy Indications:              High risk colon cancer surveillance: Personal                            history of sessile serrated colon polyp (less than                            10 mm in size) with no dysplasia Medicines:                Monitored Anesthesia Care Procedure:                Pre-Anesthesia Assessment:                           - Prior to the procedure, a History and Physical                            was performed, and patient medications and                            allergies were reviewed. The patient's tolerance of                            previous anesthesia was also reviewed. The risks                            and benefits of the procedure and the sedation                            options and risks were discussed with the patient.                            All questions were answered, and informed consent                            was obtained. Prior Anticoagulants: The patient has                            taken no anticoagulant or antiplatelet agents. ASA                            Grade Assessment: II - A patient with mild systemic                            disease. After reviewing the risks and benefits,                            the patient was deemed in satisfactory condition to  undergo the procedure.                           After obtaining informed consent, the colonoscope                            was passed under direct vision. Throughout the                            procedure, the patient's blood pressure, pulse, and                            oxygen saturations were monitored continuously. The                            Olympus Scope [id number]  was introduced through the                            anus and advanced to the the cecum, identified by                            appendiceal orifice and ileocecal valve. The                            colonoscopy was performed without difficulty. The                            patient tolerated the procedure well. The quality                            of the bowel preparation was good. The ileocecal                            valve, appendiceal orifice, and rectum were                            photographed. Scope In: 10:14:57 AM Scope Out: 10:29:46 AM Scope Withdrawal Time: 0 hours 9 minutes 14 seconds  Total Procedure Duration: 0 hours 14 minutes 49 seconds  Findings:                 Hemorrhoids were found on perianal exam.                           A 4 mm polyp was found in the ascending colon. The                            polyp was sessile. The polyp was removed with a                            cold snare. Resection and retrieval were complete.                           Non-bleeding external and internal hemorrhoids were  found during retroflexion. The hemorrhoids were                            large. Complications:            No immediate complications. Estimated Blood Loss:     Estimated blood loss was minimal. Impression:               - Hemorrhoids found on perianal exam.                           - One 4 mm polyp in the ascending colon, removed                            with a cold snare. Resected and retrieved.                           - Non-bleeding external and internal hemorrhoids. Recommendation:           - Resume previous diet.                           - Continue present medications.                           - Await pathology results.                           - Repeat colonoscopy in 5-10 years for surveillance.                           - Return to GI clinic for hemorrhoid band ligation                            PRN. [name], MD 05/29/2024 10:37:03 AM This report has been signed electronically.

## 2024-05-29 NOTE — Progress Notes (Signed)
 Pt's states no medical or surgical changes since previsit or office visit.

## 2024-05-29 NOTE — Progress Notes (Signed)
 [facility] History and Physical   Primary Care Physician:  Jimmy Charlie FERNS, MD   Reason for Procedure:  History of adenomatous colon polyps  Plan:    Surveillance colonoscopy with possible interventions as needed     HPI: Samantha Howell is a very pleasant 57 y.o. female here for surveillance colonoscopy. Denies any nausea, vomiting, abdominal pain, melena or bright red blood per rectum  The risks and benefits as well as alternatives of endoscopic procedure(s) have been discussed and reviewed.  The patient was provided an opportunity to ask questions and all were answered. The patient agreed with the plan and demonstrated an understanding of the instructions.   Past Medical History:  Diagnosis Date   Abdominal pain    Allergic rhinitis    Allergy    Anemia    history   Arthritis    Cataract    Chronic kidney disease    stones   Fibrocystic breast    GERD (gastroesophageal reflux disease)    HLD (hyperlipidemia)    diet controlled, no meds   Migraines    Nausea    SVD (spontaneous vaginal delivery)    x 2    Past Surgical History:  Procedure Laterality Date   BREAST EXCISIONAL BIOPSY Left 2001   benign   BREAST LUMPECTOMY  07/1999   Left breast   CHOLECYSTECTOMY  06/13/2011   Procedure: LAPAROSCOPIC CHOLECYSTECTOMY WITH INTRAOPERATIVE CHOLANGIOGRAM;  Surgeon: Elon CHRISTELLA Pacini, MD;  Location: [facility];  Service: General;  Laterality: N/A;   COLONOSCOPY     TUBAL LIGATION  1992   WISDOM TOOTH EXTRACTION      Prior to Admission medications   Medication Sig Start Date End Date Taking? Authorizing Provider  acetaminophen  (TYLENOL ) 500 MG tablet Take 500 mg by mouth every 6 (six) hours as needed.   Yes [provider]  cetirizine (ZYRTEC) 10 MG tablet Take 10 mg by mouth daily.   Yes [provider]  diphenhydrAMINE (BENADRYL) 25 mg capsule Take 25 mg by mouth as needed (bedtime).   Yes [provider]  omeprazole (PRILOSEC  OTC) 20 MG tablet Take 20 mg by mouth daily.   Yes [provider]  Cholecalciferol 100 MCG (4000 UT) CAPS Take 1 capsule by mouth daily.    [provider]  fluticasone  (FLONASE ) 50 MCG/ACT nasal spray Place 2 sprays into both nostrils daily. Patient taking differently: Place 2 sprays into both nostrils as needed. 10/06/15   Rilla Baller, MD  ibuprofen (ADVIL,MOTRIN) 200 MG tablet Take 200 mg by mouth as needed.    [provider]  Multiple Vitamin (MULTIVITAMIN) tablet Take 1 tablet by mouth daily. Patient not taking: Reported on 05/15/2024    [provider]  promethazine  (PHENERGAN ) 25 MG tablet TAKE 1 TABLET BY MOUTH EVERY 6 HOURS AS NEEDED FOR NAUSEA OR VOMITING. 02/22/24   Jimmy Charlie FERNS, MD    Current Outpatient Medications  Medication Sig Dispense Refill   acetaminophen  (TYLENOL ) 500 MG tablet Take 500 mg by mouth every 6 (six) hours as needed.     cetirizine (ZYRTEC) 10 MG tablet Take 10 mg by mouth daily.     diphenhydrAMINE (BENADRYL) 25 mg capsule Take 25 mg by mouth as needed (bedtime).     omeprazole (PRILOSEC OTC) 20 MG tablet Take 20 mg by mouth daily.     Cholecalciferol 100 MCG (4000 UT) CAPS Take 1 capsule by mouth daily.     fluticasone  (FLONASE ) 50 MCG/ACT nasal spray  Place 2 sprays into both nostrils daily. (Patient taking differently: Place 2 sprays into both nostrils as needed.) 16 g 1   ibuprofen (ADVIL,MOTRIN) 200 MG tablet Take 200 mg by mouth as needed.     Multiple Vitamin (MULTIVITAMIN) tablet Take 1 tablet by mouth daily. (Patient not taking: Reported on 05/15/2024)     promethazine  (PHENERGAN ) 25 MG tablet TAKE 1 TABLET BY MOUTH EVERY 6 HOURS AS NEEDED FOR NAUSEA OR VOMITING. 30 tablet 0   Current Facility-Administered Medications  Medication Dose Route Frequency Provider Last Rate Last Admin   0.9 %  sodium chloride  infusion  500 mL Intravenous Once Dura Mccormack V, MD        Allergies as of 05/29/2024 - Review  Complete 05/29/2024  Allergen Reaction Noted   Sulfonamide derivatives Hives 12/16/2007    Family History  Problem Relation Age of Onset   Early menopause Mother    Arthritis Mother    Colon polyps Father    Heart attack Father    Hyperlipidemia Father    Hearing loss Father    Atrial fibrillation Father    Stroke Father    Crohn's disease Sister    Ulcerative colitis Sister    Ovarian cancer Paternal Aunt    Cancer Paternal Aunt        ovarian   Cancer Maternal Grandmother        Bladder, breast   Breast cancer Other        MGGM   Colon cancer Neg Hx    Esophageal cancer Neg Hx    Rectal cancer Neg Hx    Stomach cancer Neg Hx     Social History   Socioeconomic History   Marital status: Married    Spouse name: Not on file   Number of children: 2   Years of education: Not on file   Highest education level: Not on file  Occupational History   Occupation: Primary School Teacher: [employer]  Tobacco Use   Smoking status: Former    Current packs/day: 0.00    Average packs/day: 0.3 packs/day for 4.0 years (1.0 ttl pk-yrs)    Types: Cigarettes    Start date: 07/31/1992    Quit date: 07/31/1996    Years since quitting: 27.8    Passive exposure: Never   Smokeless tobacco: Never  Vaping Use   Vaping status: Never Used  Substance and Sexual Activity   Alcohol use: Yes    Alcohol/week: 0.0 standard drinks of alcohol    Comment: Occasional beer   Drug use: No   Sexual activity: Not on file  Other Topics Concern   Not on file  Social History Narrative   IT department (contracts) for [employer]      Married; 2 children      Social Drivers of Corporate Investment Banker Strain: Not on file  Food Insecurity: Not on file  Transportation Needs: Not on file  Physical Activity: Not on file  Stress: Not on file  Social Connections: Not on file  Intimate Partner Violence: Not on file    Review of Systems:  All other review of systems negative except as mentioned in the  HPI.  Physical Exam: Vital signs in last 24 hours: BP 135/80   Pulse 97   Temp 98.3 F (36.8 C)   Ht 4' 11 (1.499 m)   Wt 126 lb (57.2 kg)   LMP 03/02/2013   SpO2 97%   BMI 25.45 kg/m  General:  Alert, NAD Lungs:  Clear .   Heart:  Regular rate and rhythm Abdomen:  Soft, nontender and nondistended. Neuro/Psych:  Alert and cooperative. Normal mood and affect. A and O x 3  Reviewed labs, radiology imaging, old records and pertinent past GI work up  Patient is appropriate for planned procedure(s) and anesthesia in an ambulatory setting   K. Veena Francess Mullen , MD [phone]

## 2024-05-29 NOTE — Progress Notes (Signed)
 Called to room to assist during endoscopic procedure.  Patient ID and intended procedure confirmed with present staff. Received instructions for my participation in the procedure from the performing physician.

## 2024-05-29 NOTE — Patient Instructions (Addendum)
 Educational handout provided to patient related to Hemorrhoids and Polyps  Resume previous diet  Continue present medications  Awaiting pathology results   YOU HAD AN ENDOSCOPIC PROCEDURE TODAY AT THE [facility]:   Refer to the procedure report that was given to you for any specific questions about what was found during the examination.  If the procedure report does not answer your questions, please call your gastroenterologist to clarify.  If you requested that your care partner not be given the details of your procedure findings, then the procedure report has been included in a sealed envelope for you to review at your convenience later.  YOU SHOULD EXPECT: Some feelings of bloating in the abdomen. Passage of more gas than usual.  Walking can help get rid of the air that was put into your GI tract during the procedure and reduce the bloating. If you had a lower endoscopy (such as a colonoscopy or flexible sigmoidoscopy) you may notice spotting of blood in your stool or on the toilet paper. If you underwent a bowel prep for your procedure, you may not have a normal bowel movement for a few days.  Please Note:  You might notice some irritation and congestion in your nose or some drainage.  This is from the oxygen used during your procedure.  There is no need for concern and it should clear up in a day or so.  SYMPTOMS TO REPORT IMMEDIATELY:  Following lower endoscopy (colonoscopy or flexible sigmoidoscopy):  Excessive amounts of blood in the stool  Significant tenderness or worsening of abdominal pains  Swelling of the abdomen that is new, acute  Fever of 100F or higher  For urgent or emergent issues, a gastroenterologist can be reached at any hour by calling [phone]. Do not use MyChart messaging for urgent concerns.    DIET:  We do recommend a small meal at first, but then you may proceed to your regular diet.  Drink plenty of fluids but you should avoid alcoholic  beverages for 24 hours.  ACTIVITY:  You should plan to take it easy for the rest of today and you should NOT DRIVE or use heavy machinery until tomorrow (because of the sedation medicines used during the test).    FOLLOW UP: Our staff will call the number listed on your records the next business day following your procedure.  We will call around 7:15- 8:00 am to check on you and address any questions or concerns that you may have regarding the information given to you following your procedure. If we do not reach you, we will leave a message.     If any biopsies were taken you will be contacted by phone or by letter within the next 1-3 weeks.  Please call us at [phone] if you have not heard about the biopsies in 3 weeks.    SIGNATURES/CONFIDENTIALITY: You and/or your care partner have signed paperwork which will be entered into your electronic medical record.  These signatures attest to the fact that that the information above on your After Visit Summary has been reviewed and is understood.  Full responsibility of the confidentiality of this discharge information lies with you and/or your care-partner.

## 2024-05-29 NOTE — Progress Notes (Signed)
 Vss nad trans to pacu

## 2024-05-30 ENCOUNTER — Telehealth: Payer: Self-pay | Admitting: *Deleted

## 2024-05-30 NOTE — Telephone Encounter (Signed)
  Follow up Call-     05/29/2024    8:56 AM  Call back number  Post procedure Call Back phone  # [phone]  Permission to leave phone message Yes     Patient questions:  Do you have a fever, pain , or abdominal swelling? No. Pain Score  0 *  Have you tolerated food without any problems? Yes.    Have you been able to return to your normal activities? Yes.    Do you have any questions about your discharge instructions: Diet   No. Medications  No. Follow up visit  No.  Do you have questions or concerns about your Care? No.  Actions: * If pain score is 4 or above: No action needed, pain <4.

## 2024-06-02 ENCOUNTER — Ambulatory Visit: Payer: Self-pay | Admitting: Gastroenterology

## 2024-06-02 LAB — SURGICAL PATHOLOGY

## 2024-06-03 NOTE — Progress Notes (Deleted)
     Samantha Gayler T. Samantha Hiegel, MD, CAQ Sports Medicine [facility] at [facility] [address] [place], [address]  Phone: [phone]  FAX: [phone]  Samantha Howell - 58 y.o. female  MRN [id number]  Date of Birth: October 11, 1965  Date: 06/04/2024  PCP: Jimmy Charlie FERNS, MD  Referral: Jimmy Charlie FERNS, MD  No chief complaint on file.  Subjective:   Samantha Howell is a 58 y.o. very pleasant female patient with There is no height or weight on file to calculate BMI. who presents with the following:  Discussed the use of AI scribe software for clinical note transcription with the patient, who gave verbal consent to proceed.  I remember Samantha Howell well from over the years.  She now has right-sided back pain, hip pain and radicular leg pain. History of Present Illness     Review of Systems is noted in the HPI, as appropriate  Objective:   LMP 03/02/2013   GEN: No acute distress; alert,appropriate. PULM: Breathing comfortably in no respiratory distress PSYCH: Normally interactive.   Laboratory and Imaging Data:  Assessment and Plan:   No diagnosis found. Assessment & Plan   Medication Management during today's office visit: No orders of the defined types were placed in this encounter.  There are no discontinued medications.  Orders placed today for conditions managed today: No orders of the defined types were placed in this encounter.   Disposition: No follow-ups on file.  Dragon [employer] One speech-to-text software was used for transcription in this dictation.  Possible transcriptional errors can occur using Animal nutritionist.   Signed,  Jacques DASEN. Yashas Camilli, MD   Outpatient Encounter Medications as of 06/04/2024  Medication Sig   acetaminophen  (TYLENOL ) 500 MG tablet Take 500 mg by mouth every 6 (six) hours as needed.   cetirizine (ZYRTEC) 10 MG tablet Take 10 mg by mouth daily.   Cholecalciferol 100 MCG (4000 UT) CAPS Take 1 capsule by mouth  daily.   diphenhydrAMINE (BENADRYL) 25 mg capsule Take 25 mg by mouth as needed (bedtime).   fluticasone  (FLONASE ) 50 MCG/ACT nasal spray Place 2 sprays into both nostrils daily. (Patient taking differently: Place 2 sprays into both nostrils as needed.)   ibuprofen (ADVIL,MOTRIN) 200 MG tablet Take 200 mg by mouth as needed.   Multiple Vitamin (MULTIVITAMIN) tablet Take 1 tablet by mouth daily. (Patient not taking: Reported on 05/15/2024)   omeprazole (PRILOSEC OTC) 20 MG tablet Take 20 mg by mouth daily.   promethazine  (PHENERGAN ) 25 MG tablet TAKE 1 TABLET BY MOUTH EVERY 6 HOURS AS NEEDED FOR NAUSEA OR VOMITING.   No facility-administered encounter medications on file as of 06/04/2024.

## 2024-06-04 ENCOUNTER — Ambulatory Visit: Admitting: Family Medicine

## 2024-06-04 DIAGNOSIS — M5416 Radiculopathy, lumbar region: Secondary | ICD-10-CM

## 2024-06-08 NOTE — Progress Notes (Signed)
     Samantha Cayson T. Mercadez Heitman, MD, CAQ Sports Medicine [facility] at [facility] [address] [place], [address]  Phone: [phone]  FAX: [phone]  Samantha Howell - 58 y.o. female  MRN [id number]  Date of Birth: 02/15/1966  Date: 06/11/2024  PCP: Samantha Charlie FERNS, MD  Referral: Samantha Charlie FERNS, MD  No chief complaint on file.  Subjective:   Samantha Howell is a 58 y.o. very pleasant female patient with There is no height or weight on file to calculate BMI. who presents with the following:  Discussed the use of AI scribe software for clinical note transcription with the patient, who gave verbal consent to proceed.  I remember Samantha Howell well from over the years.  She now has right-sided back pain, hip pain and radicular leg pain. History of Present Illness     Review of Systems is noted in the HPI, as appropriate  Objective:   LMP 03/02/2013   GEN: No acute distress; alert,appropriate. PULM: Breathing comfortably in no respiratory distress PSYCH: Normally interactive.   Laboratory and Imaging Data:  Assessment and Plan:   No diagnosis found. Assessment & Plan   Medication Management during today's office visit: No orders of the defined types were placed in this encounter.  There are no discontinued medications.  Orders placed today for conditions managed today: No orders of the defined types were placed in this encounter.   Disposition: No follow-ups on file.  Dragon [employer] One speech-to-text software was used for transcription in this dictation.  Possible transcriptional errors can occur using Animal nutritionist.   Signed,  Samantha Howell. Samantha Holness, MD   Outpatient Encounter Medications as of 06/11/2024  Medication Sig   acetaminophen  (TYLENOL ) 500 MG tablet Take 500 mg by mouth every 6 (six) hours as needed.   cetirizine (ZYRTEC) 10 MG tablet Take 10 mg by mouth daily.   Cholecalciferol 100 MCG (4000 UT) CAPS Take 1 capsule by mouth  daily.   diphenhydrAMINE (BENADRYL) 25 mg capsule Take 25 mg by mouth as needed (bedtime).   fluticasone  (FLONASE ) 50 MCG/ACT nasal spray Place 2 sprays into both nostrils daily. (Patient taking differently: Place 2 sprays into both nostrils as needed.)   ibuprofen (ADVIL,MOTRIN) 200 MG tablet Take 200 mg by mouth as needed.   Multiple Vitamin (MULTIVITAMIN) tablet Take 1 tablet by mouth daily. (Patient not taking: Reported on 05/15/2024)   omeprazole (PRILOSEC OTC) 20 MG tablet Take 20 mg by mouth daily.   promethazine  (PHENERGAN ) 25 MG tablet TAKE 1 TABLET BY MOUTH EVERY 6 HOURS AS NEEDED FOR NAUSEA OR VOMITING.   No facility-administered encounter medications on file as of 06/11/2024.

## 2024-06-10 DIAGNOSIS — F411 Generalized anxiety disorder: Secondary | ICD-10-CM | POA: Diagnosis not present

## 2024-06-11 ENCOUNTER — Ambulatory Visit
Admission: RE | Admit: 2024-06-11 | Discharge: 2024-06-11 | Disposition: A | Discharge status: Home / Self Care | Source: Ambulatory Visit | Attending: Family Medicine | Admitting: Family Medicine

## 2024-06-11 ENCOUNTER — Ambulatory Visit: Admitting: Family Medicine

## 2024-06-11 VITALS — BP 118/80 | HR 67 | Temp 97.5°F | Ht 59.0 in | Wt 132.0 lb

## 2024-06-11 DIAGNOSIS — M25551 Pain in right hip: Secondary | ICD-10-CM

## 2024-06-11 DIAGNOSIS — M47816 Spondylosis without myelopathy or radiculopathy, lumbar region: Secondary | ICD-10-CM | POA: Diagnosis not present

## 2024-06-11 DIAGNOSIS — M5416 Radiculopathy, lumbar region: Secondary | ICD-10-CM

## 2024-06-11 DIAGNOSIS — M160 Bilateral primary osteoarthritis of hip: Secondary | ICD-10-CM | POA: Diagnosis not present

## 2024-06-11 DIAGNOSIS — G8929 Other chronic pain: Secondary | ICD-10-CM

## 2024-06-11 DIAGNOSIS — M549 Dorsalgia, unspecified: Secondary | ICD-10-CM | POA: Diagnosis not present

## 2024-06-11 NOTE — Patient Instructions (Signed)
 Vitamin D , at least 2,000 units a day  Calcium 800 mg a day

## 2024-06-13 ENCOUNTER — Ambulatory Visit: Payer: Self-pay | Admitting: Family Medicine

## 2024-07-28 ENCOUNTER — Encounter: Payer: BC Managed Care – PPO | Admitting: Family

## 2024-07-29 NOTE — Therapy (Signed)
 " OUTPATIENT PHYSICAL THERAPY THORACOLUMBAR EVALUATION/TREATMENT   Patient Name: Samantha Howell MRN: [id number] DOB:[birth year], 58 y.o., female Today's Date: 08/05/2024  END OF SESSION:  PT End of Session - 08/05/24 1025     Visit Number 1    Number of Visits 17    Date for Recertification  09/30/24    PT Start Time 1025    PT Stop Time 1110    PT Time Calculation (min) 45 min          Past Medical History:  Diagnosis Date   Allergic rhinitis    Allergy    Anemia    history   Cataract    Chronic kidney disease    stones   Fibrocystic breast    GERD (gastroesophageal reflux disease)    HLD (hyperlipidemia)    diet controlled, no meds   Migraines    Past Surgical History:  Procedure Laterality Date   BREAST EXCISIONAL BIOPSY Left 2001   benign   BREAST LUMPECTOMY  07/1999   Left breast   CHOLECYSTECTOMY  06/13/2011   Procedure: LAPAROSCOPIC CHOLECYSTECTOMY WITH INTRAOPERATIVE CHOLANGIOGRAM;  Surgeon: Elon CHRISTELLA Pacini, MD;  Location: [facility];  Service: General;  Laterality: N/A;   COLONOSCOPY     TUBAL LIGATION  1992   WISDOM TOOTH EXTRACTION     Patient Active Problem List   Diagnosis Date Noted   Headache syndrome 03/13/2017   Routine general medical examination at a health care facility 05/23/2011   Hyperlipemia 01/04/2009   ELEVATED BLOOD PRESSURE WITHOUT DIAGNOSIS OF HYPERTENSION 01/27/2008   Seasonal allergic rhinitis due to pollen 12/16/2007   GERD 12/16/2007   FIBROCYSTIC BREAST DISEASE 12/16/2007    PCP: Jimmy Charlie FERNS, MD  REFERRING PROVIDER: Watt Mirza, MD  REFERRING DIAG:  M54.16 (ICD-10-CM) - Lumbar radiculopathy, right  M25.551,G89.29 (ICD-10-CM) - Chronic hip pain, right   RATIONALE FOR EVALUATION AND TREATMENT: Rehabilitation  THERAPY DIAG:   ONSET DATE: Chronic  FOLLOW-UP APPT SCHEDULED WITH REFERRING PROVIDER: No    SUBJECTIVE:                                                                                                                                                                                          SUBJECTIVE STATEMENT:    Patient is a 58 y.o. female presenting to OPPT with chief concern of lower back pain and hip pain.   PERTINENT HISTORY:   YOUA Howell is a 58 y.o. female presenting with lower back pain radiating into the lateral anterior hip, groin and thigh. She describes her R leg as restless and provoked physical activities including housework. Patient reports that's  she has been trying to some stretches and exercises that Dr. Watt provided but the exacerbated the pain. She reports that she has to sit with a heating pad every day in order to mitigate the pain. She reports bilateral numbness and tingling in bilateral feet. She reports past history of tight IT band syndrome in the R side (resolved)and fibromas in her bilateral feet. She wears [employer] due to heel pain and to provide better foot support. Alleviating factors include tylenol  at night and a heating pad. Aggravating factors include: vacuuming, mopping, holding grand children (infant)   She denies numbness and tingling and surgical history of hip or lower back.  Red flags: Negative for bowel/bladder changes, saddle paresthesia   Imaging (Per Chart Review 06/11/2024):      (Lumbar Imaging):  EXAM: 4 VIEW(S) XRAY OF THE LUMBAR SPINE 06/11/2024 11:00:39 AM   COMPARISON: 05/31/2015   CLINICAL HISTORY: chronic back pain, R chronic back pain, R   FINDINGS:   LUMBAR SPINE: BONES: 5 lumbar-type vertebral bodies are well visualized. Osteophytic changes are noted. No pars defects are seen. No anterolisthesis is seen. No acute fracture.   DISCS AND DEGENERATIVE CHANGES: Mild facet hypertrophic changes are noted.   SOFT TISSUES: No soft tissue abnormality is noted.   IMPRESSION: 1. No acute abnormality of the lumbar spine. 2. Mild facet hypertrophic changes.   Electronically signed by: Oneil Devonshire MD  06/13/2024 01:41 AM EST RP Workstation: [id number]  (Hip)  EXAM: 3 VIEW(S) XRAY OF THE BILATERAL HIP 06/11/2024 11:00:39 AM   COMPARISON: None available.   CLINICAL HISTORY: R hip pain, assess OA.   FINDINGS:   BONES AND JOINTS: No acute fracture or focal osseous lesion. Degenerative changes of the hip joints are noted bilaterally, right worse than left. Mild spurring of inferior SI joints.   LUMBAR SPINE: Degenerative changes in the lumbar spine are noted.   SOFT TISSUES: Surgical clip in left hemipelvis.   IMPRESSION: 1. Degenerative changes of the hip joints bilaterally, right worse than left. 2. Mild spurring of inferior SI joints. 3. Degenerative changes in the lumbar spine.   Electronically signed by: Oneil Devonshire MD 06/13/2024 01:42 AM EST RP Workstation: [id number]   PAIN:    Pain Intensity: Present: 1/10, Best: 0/10, Worst: 3-4/10 Pain location: Lower back, R hip Pain Quality: intermittent, dull, and aching  Radiating: Yes  Numbness/Tingling: No  PRECAUTIONS: None  WEIGHT BEARING RESTRICTIONS: No  FALLS: Has patient fallen in last 6 months? No  Living Environment Lives with: lives with their spouse Lives in: House/apartment Stairs: Yes: Internal: 13 steps; on left going up and External: 3-4 steps; can reach both Has following equipment at home: None  Prior level of function: Independent  Occupational demands: Office Job   Patient Goals: I would like to get stronger, flexible and more mobile    OBJECTIVE:  Patient Surveys  MODIFIED OSWESTRY DISABILITY SCALE  Date: 08/05/2024 Score  Pain intensity 1 = The pain is bad, but I can manage without having to take pain medication  2. Personal care (washing, dressing, etc.) 0 =  I can take care of myself normally without causing increased pain.  3. Lifting 1 = I can lift heavy weights, but it causes increased pain.  4. Walking Not Answered**  5. Sitting 1 =  I can only sit in my favorite chair as  long as I like.  6. Standing 1 =  I can stand as long as I want but, it increases my  pain.  7. Sleeping 1 = I can sleep well only by using pain medication.  8. Social Life 1 =  My social life is normal, but it increases my level of pain.  9. Traveling 1 =  I can travel anywhere, but it increases my pain.  10. Employment/ Homemaking 1 = My normal homemaking/job activities increase my pain, but I can still perform all that is required of me  Total 8/50  ** = mODI incompleted - patient's score TBD  Interpretation of scores: Score Category Description  0-20% Minimal Disability The patient can cope with most living activities. Usually no treatment is indicated apart from advice on lifting, sitting and exercise  21-40% Moderate Disability The patient experiences more pain and difficulty with sitting, lifting and standing. Travel and social life are more difficult and they may be disabled from work. Personal care, sexual activity and sleeping are not grossly affected, and the patient can usually be managed by conservative means  41-60% Severe Disability Pain remains the main problem in this group, but activities of daily living are affected. These patients require a detailed investigation  61-80% Crippled Back pain impinges on all aspects of the patients life. Positive intervention is required  81-100% Bed-bound These patients are either bed-bound or exaggerating their symptoms  Bluford FORBES Zoe DELENA Karon DELENA, et al. Surgery versus conservative management of stable thoracolumbar fracture: the PRESTO feasibility RCT. [place] ([place]): [employer]; 2021 Nov. [facility] Technology Assessment, No. 25.62.) Appendix 3, Oswestry Disability Index category descriptors. Available from: Findjewelers.cz  Minimally Clinically Important Difference (MCID) = 12.8%   Cognition WNL    Gross Musculoskeletal Assessment Tremor: None Bulk: Normal Tone: Normal No visible step-off  along spinal column, no signs of scoliosis  GAIT: Distance walked: 29m   Assistive device utilized: None Level of assistance: Complete Independence Comments: WNL, Reciprocal Gait    Posture: Lumbar lordosis: WNL Iliac crest height: Equal bilaterally Lumbar lateral shift: Negative  Seated: Rounded Shoulders, Increased Thoracic Kyphosis  AROM AROM (Normal range in degrees) AROM   Lumbar   Flexion (65) 100%  Extension (30) 100%  Right lateral flexion (25) 100%  Left lateral flexion (25) 100%*  Right rotation (30) 100%  Left rotation (30)       Hip Right Left  Flexion (125) WNL WNL  Extension (15)    Abduction (40)    Adduction     Internal Rotation (45) 25 25   External Rotation (45) WNL  WNL      Knee    Flexion (135)    Extension (0)        Ankle    Dorsiflexion (20)    Plantarflexion (50)    Inversion (35)    Eversion (15)    (* = pain; Blank rows = not tested)  LE MMT: MMT (out of 5) Right  Left   Hip flexion 4- 4-  Hip extension    Hip abduction 4- 4  Hip adduction (Hooklying) 5 5  Hip internal rotation 4- 4-  Hip external rotation 4- 4-  Knee flexion 5 5  Knee extension 5 5  Ankle dorsiflexion 5 5  Ankle plantarflexion 5 5  Ankle inversion    Ankle eversion    (* = pain; Blank rows = not tested)  Sensation Grossly intact to light touch throughout bilateral LEs as determined by testing dermatomes L2-S2. Proprioception, stereognosis, and hot/cold testing deferred on this date.  Reflexes R/L Knee Jerk (L3/4): 2+/2+    Muscle  Length Hamstrings: R: Positive for tight 35 deg L: negative  Palpation Location Right Left         Lumbar paraspinals    Quadratus Lumborum    Gluteus Maximus    Gluteus Medius 1   Deep hip external rotators 1   PSIS    Fortin's Area (SIJ)    Greater Trochanter 1   (Blank rows = not tested) Graded on 0-4 scale (0 = no pain, 1 = pain, 2 = pain with wincing/grimacing/flinching, 3 = pain with withdrawal, 4 =  unwilling to allow palpation)  Passive Accessory  Motion Pt denies reproduction of back pain with CPA L1-L5 and UPA bilaterally L1-L5. Generally, hypomobile throughout  Special Tests Lumbar Radiculopathy and Discogenic:  SLR (SN 92, -LR 0.29): R: Negative, pt endorsed tightness along posterior knee L:  Negative Crossed SLR (SP 90): R: Negative L: Negative  Hip: FABER (SN 81): R: Positive for glute tightness L: Negative FADIR (SN 94): R: Negative L: Negative Hip scour (SN 50): R: Negative L: Negative   Functional Tasks Deep squat: Narrow BoS, increased trunk flexion, bilateral knee pain   : 10.45 self selected = .95 m/s  5TSTS: 11.67s = (Mean: 50-59 years - 7.1s)  TODAY'S TREATMENT: DATE: 08/05/2024  Reviewed HEP with return demonstration:   Access Code: [email or web address] URL: [email or web address] Date: 08/05/2024 Prepared by: Lonni Pall  Exercises - Supine Bridge  - 1 x daily - 3-4 x weekly - 2-3 sets - 10-12 reps - Straight Leg Raise  - 1 x daily - 3-4 x weekly - 2-3 sets - 10-12 reps - Supine 90/90 leg extensions  - 1 x daily - 3-4 x weekly - 2-3 sets - 10-12 reps - Cat Cow  - 2 x daily - 7 x weekly - 2-3 sets - 10 reps - Quadruped Full Range Thoracic Rotation with Reach  - 2 x daily - 7 x weekly - 2-3 sets - 10 reps   PATIENT EDUCATION:  Education details: HEP, POC, Prognosis Person educated: Patient Education method: Explanation, Demonstration, and Handouts Education comprehension: verbalized understanding, returned demonstration, and tactile cues required   HOME EXERCISE PROGRAM:  Access Code: [email or web address] URL: [email or web address] Date: 08/05/2024 Prepared by: Lonni Pall  Exercises - Supine Bridge  - 1 x daily - 3-4 x weekly - 2-3 sets - 10-12 reps - Straight Leg Raise  - 1 x daily - 3-4 x weekly - 2-3 sets - 10-12 reps - Supine 90/90 leg extensions  - 1 x daily - 3-4 x weekly - 2-3 sets - 10-12 reps - Cat Cow  - 2 x daily  - 7 x weekly - 2-3 sets - 10 reps - Quadruped Full Range Thoracic Rotation with Reach  - 2 x daily - 7 x weekly - 2-3 sets - 10 reps   ASSESSMENT:  CLINICAL IMPRESSION: Patient is a 58 y.o. female who was seen today for physical therapy evaluation and treatment for lumbago and R hip pain. Patient presented with deficits in LE strength, core stability and pain in the R gluteal muscles. Unable to reproduce symptoms relative to R radiculopathy with special testing. Patient TTP along R greater trochanter, deep gluteals, and piriformis; no reproduction of distal symptoms with palpation to piriformis. Gross AROM within functional limits  however concordant pain for the R hip/greater trochanter noted with L lateral trunk flexion; indicative of glute med tightness or IT band involvement. Based on today's performance, patient will benefit from skilled physical therapy in order to maximize return  to PLOF and improve QoL.  OBJECTIVE IMPAIRMENTS: decreased activity tolerance, decreased mobility, decreased strength, increased muscle spasms, postural dysfunction, and pain.   ACTIVITY LIMITATIONS: lifting, standing, squatting, and stairs  PARTICIPATION LIMITATIONS: community activity, occupation, and yard work  PERSONAL FACTORS: Age, Past/current experiences, Profession, and Time since onset of injury/illness/exacerbation are also affecting patient's functional outcome.   REHAB POTENTIAL: Good  CLINICAL DECISION MAKING: Evolving/moderate complexity  EVALUATION COMPLEXITY: Moderate   GOALS: Goals reviewed with patient? No  SHORT TERM GOALS: Target date: 09/02/2024  Pt will be independent with HEP in order to improve strength and decrease back pain to improve pain-free function at home and work. Baseline: Initial HEP provided  Goal status: INITIAL   LONG TERM GOALS: Target date: 09/30/2024  Pt will increase 30s STS repetitions by 5 reps in order to demonstrate improvements in LE strength and  endurance for ADLs.   Baseline: TBD Goal status: INITIAL  2.  Pt will decrease worst back pain by at least 2 points on the NPRS in order to demonstrate clinically significant reduction in back pain. Baseline: 4/10 Goal status: INITIAL  3.  Pt will decrease mODI score by at least 13 points in order demonstrate clinically significant reduction in back pain/disability.       Baseline: TBD  Goal status: INITIAL  4.  Pt will be able to squat and lift 30# KB pain free with good technique in order to demonstrate improvements in biomechanics needed for lifting and squatting during ADLs. Baseline: TBD Goal status: INITIAL  5. Pt will increase by at least 0.13 m/s in order to demonstrate clinically significant improvement in community ambulation.  Baseline: 10.45 self selected = .95 m/s Goal status: INITIAL  6.  Pt will decrease 5TSTS by at least 3 seconds in order to demonstrate clinically significant improvement in LE strength Baseline: 11.67s Goal status: INITIAL  PLAN:  PT FREQUENCY: 1-2x/week  PT DURATION: 8 weeks  PLANNED INTERVENTIONS: [id number]- PT Re-evaluation, 97750- Physical Performance Testing, 97110-Therapeutic exercises, 97530- Therapeutic activity, 97112- Neuromuscular re-education, 97535- Self Care, [id number]- Manual therapy, Patient/Family education, and Joint mobilization  PLAN FOR NEXT SESSION: Complete mODI, Initiate hip strengthening, core stability, thoracolumbar mobility     Lonni Pall PT, DPT Physical Therapist- [place]  08/05/2024, 10:27 AM  "

## 2024-08-05 ENCOUNTER — Ambulatory Visit: Attending: Family Medicine

## 2024-08-05 DIAGNOSIS — G8929 Other chronic pain: Secondary | ICD-10-CM | POA: Insufficient documentation

## 2024-08-05 DIAGNOSIS — M5459 Other low back pain: Secondary | ICD-10-CM | POA: Insufficient documentation

## 2024-08-05 DIAGNOSIS — M25551 Pain in right hip: Secondary | ICD-10-CM | POA: Diagnosis present

## 2024-08-05 DIAGNOSIS — M5416 Radiculopathy, lumbar region: Secondary | ICD-10-CM | POA: Diagnosis not present

## 2024-08-05 DIAGNOSIS — M6281 Muscle weakness (generalized): Secondary | ICD-10-CM | POA: Insufficient documentation

## 2024-08-07 ENCOUNTER — Ambulatory Visit

## 2024-08-07 DIAGNOSIS — M6281 Muscle weakness (generalized): Secondary | ICD-10-CM

## 2024-08-07 DIAGNOSIS — M5459 Other low back pain: Secondary | ICD-10-CM | POA: Diagnosis not present

## 2024-08-07 DIAGNOSIS — M25551 Pain in right hip: Secondary | ICD-10-CM

## 2024-08-07 NOTE — Therapy (Signed)
 " OUTPATIENT PHYSICAL THERAPY THORACOLUMBAR TREATMENT   Patient Name: Samantha Howell: [id number] DOB:21-Nov-1965, 59 y.o., female Today's Date: 08/07/2024  END OF SESSION:  PT End of Session - 08/07/24 1523     Visit Number 2    Number of Visits 17    Date for Recertification  09/30/24    PT Start Time 1515    PT Stop Time 1555    PT Time Calculation (min) 40 min    Activity Tolerance Patient tolerated treatment well    Behavior During Therapy [facility] for tasks assessed/performed           Past Medical History:  Diagnosis Date   Allergic rhinitis    Allergy    Anemia    history   Cataract    Chronic kidney disease    stones   Fibrocystic breast    GERD (gastroesophageal reflux disease)    HLD (hyperlipidemia)    diet controlled, no meds   Migraines    Past Surgical History:  Procedure Laterality Date   BREAST EXCISIONAL BIOPSY Left 2001   benign   BREAST LUMPECTOMY  07/1999   Left breast   CHOLECYSTECTOMY  06/13/2011   Procedure: LAPAROSCOPIC CHOLECYSTECTOMY WITH INTRAOPERATIVE CHOLANGIOGRAM;  Surgeon: Samantha CHRISTELLA Pacini, MD;  Location: [facility];  Service: General;  Laterality: N/A;   COLONOSCOPY     TUBAL LIGATION  1992   WISDOM TOOTH EXTRACTION     Patient Active Problem List   Diagnosis Date Noted   Headache syndrome 03/13/2017   Routine general medical examination at a health care facility 05/23/2011   Hyperlipemia 01/04/2009   ELEVATED BLOOD PRESSURE WITHOUT DIAGNOSIS OF HYPERTENSION 01/27/2008   Seasonal allergic rhinitis due to pollen 12/16/2007   GERD 12/16/2007   FIBROCYSTIC BREAST DISEASE 12/16/2007    PCP: Samantha Charlie FERNS, MD  REFERRING PROVIDER: Watt Mirza, MD  REFERRING DIAG:  M54.16 (ICD-10-CM) - Lumbar radiculopathy, right  M25.551,G89.29 (ICD-10-CM) - Chronic hip pain, right   RATIONALE FOR EVALUATION AND TREATMENT: Rehabilitation  THERAPY DIAG:   ONSET DATE: Chronic  FOLLOW-UP APPT SCHEDULED WITH REFERRING PROVIDER: No     SUBJECTIVE:                                                                                                                                                                                         SUBJECTIVE STATEMENT:    Patient is a 59 y.o. female presenting to OPPT with chief concern of lower back pain and hip pain.   PERTINENT HISTORY:   Samantha Howell is a 59 y.o. female presenting with lower back pain radiating into the lateral anterior  hip, groin and thigh. She describes her R leg as restless and provoked physical activities including housework. Patient reports that's she has been trying to some stretches and exercises that Dr. Watt provided but the exacerbated the pain. She reports that she has to sit with a heating pad every day in order to mitigate the pain. She reports bilateral numbness and tingling in bilateral feet. She reports past history of tight IT band syndrome in the R side (resolved)and fibromas in her bilateral feet. She wears [employer] due to heel pain and to provide better foot support. Alleviating factors include tylenol  at night and a heating pad. Aggravating factors include: vacuuming, mopping, holding grand children (infant)   She denies numbness and tingling and surgical history of hip or lower back.  Red flags: Negative for bowel/bladder changes, saddle paresthesia   Imaging (Per Chart Review 06/11/2024):      (Lumbar Imaging):  EXAM: 4 VIEW(S) XRAY OF THE LUMBAR SPINE 06/11/2024 11:00:39 AM   COMPARISON: 05/31/2015   CLINICAL HISTORY: chronic back pain, R chronic back pain, R   FINDINGS:   LUMBAR SPINE: BONES: 5 lumbar-type vertebral bodies are well visualized. Osteophytic changes are noted. No pars defects are seen. No anterolisthesis is seen. No acute fracture.   DISCS AND DEGENERATIVE CHANGES: Mild facet hypertrophic changes are noted.   SOFT TISSUES: No soft tissue abnormality is noted.   IMPRESSION: 1. No acute  abnormality of the lumbar spine. 2. Mild facet hypertrophic changes.   Electronically signed by: Samantha Devonshire MD 06/13/2024 01:41 AM EST RP Workstation: [id number]  (Hip)  EXAM: 3 VIEW(S) XRAY OF THE BILATERAL HIP 06/11/2024 11:00:39 AM   COMPARISON: None available.   CLINICAL HISTORY: R hip pain, assess OA.   FINDINGS:   BONES AND JOINTS: No acute fracture or focal osseous lesion. Degenerative changes of the hip joints are noted bilaterally, right worse than left. Mild spurring of inferior SI joints.   LUMBAR SPINE: Degenerative changes in the lumbar spine are noted.   SOFT TISSUES: Surgical clip in left hemipelvis.   IMPRESSION: 1. Degenerative changes of the hip joints bilaterally, right worse than left. 2. Mild spurring of inferior SI joints. 3. Degenerative changes in the lumbar spine.   Electronically signed by: Samantha Devonshire MD 06/13/2024 01:42 AM EST RP Workstation: [id number]   PAIN:    Pain Intensity: Present: 1/10, Best: 0/10, Worst: 3-4/10 Pain location: Lower back, R hip Pain Quality: intermittent, dull, and aching  Radiating: Yes  Numbness/Tingling: No  PRECAUTIONS: None  WEIGHT BEARING RESTRICTIONS: No  FALLS: Has patient fallen in last 6 months? No  Living Environment Lives with: lives with their spouse Lives in: House/apartment Stairs: Yes: Internal: 13 steps; on left going up and External: 3-4 steps; can reach both Has following equipment at home: None  Prior level of function: Independent  Occupational demands: Office Job   Patient Goals: I would like to get stronger, flexible and more mobile    OBJECTIVE:  Patient Surveys  MODIFIED OSWESTRY DISABILITY SCALE  Date: 08/05/2024 Score  Pain intensity 1 = The pain is bad, but I can manage without having to take pain medication  2. Personal care (washing, dressing, etc.) 0 =  I can take care of myself normally without causing increased pain.  3. Lifting 1 = I can lift heavy weights,  but it causes increased pain.  4. Walking Not Answered**  5. Sitting 1 =  I can only sit in my favorite chair as long as  I like.  6. Standing 1 =  I can stand as long as I want but, it increases my pain.  7. Sleeping 1 = I can sleep well only by using pain medication.  8. Social Life 1 =  My social life is normal, but it increases my level of pain.  9. Traveling 1 =  I can travel anywhere, but it increases my pain.  10. Employment/ Homemaking 1 = My normal homemaking/job activities increase my pain, but I can still perform all that is required of me  Total 8/50  ** = mODI incompleted - patient's score TBD  Interpretation of scores: Score Category Description  0-20% Minimal Disability The patient can cope with most living activities. Usually no treatment is indicated apart from advice on lifting, sitting and exercise  21-40% Moderate Disability The patient experiences more pain and difficulty with sitting, lifting and standing. Travel and social life are more difficult and they may be disabled from work. Personal care, sexual activity and sleeping are not grossly affected, and the patient can usually be managed by conservative means  41-60% Severe Disability Pain remains the main problem in this group, but activities of daily living are affected. These patients require a detailed investigation  61-80% Crippled Back pain impinges on all aspects of the patients life. Positive intervention is required  81-100% Bed-bound These patients are either bed-bound or exaggerating their symptoms  Bluford FORBES Zoe DELENA Karon DELENA, et al. Surgery versus conservative management of stable thoracolumbar fracture: the PRESTO feasibility RCT. [place] ([place]): [employer]; 2021 Nov. [facility] Technology Assessment, No. 25.62.) Appendix 3, Oswestry Disability Index category descriptors. Available from: Findjewelers.cz  Minimally Clinically Important Difference (MCID) =  12.8%   Cognition WNL    Gross Musculoskeletal Assessment Tremor: None Bulk: Normal Tone: Normal No visible step-off along spinal column, no signs of scoliosis  GAIT: Distance walked: 40m   Assistive device utilized: None Level of assistance: Complete Independence Comments: WNL, Reciprocal Gait    Posture: Lumbar lordosis: WNL Iliac crest height: Equal bilaterally Lumbar lateral shift: Negative  Seated: Rounded Shoulders, Increased Thoracic Kyphosis  AROM AROM (Normal range in degrees) AROM   Lumbar   Flexion (65) 100%  Extension (30) 100%  Right lateral flexion (25) 100%  Left lateral flexion (25) 100%*  Right rotation (30) 100%  Left rotation (30)       Hip Right Left  Flexion (125) WNL WNL  Extension (15)    Abduction (40)    Adduction     Internal Rotation (45) 25 25   External Rotation (45) WNL  WNL      Knee    Flexion (135)    Extension (0)        Ankle    Dorsiflexion (20)    Plantarflexion (50)    Inversion (35)    Eversion (15)    (* = pain; Blank rows = not tested)  LE MMT: MMT (out of 5) Right  Left   Hip flexion 4- 4-  Hip extension    Hip abduction 4- 4  Hip adduction (Hooklying) 5 5  Hip internal rotation 4- 4-  Hip external rotation 4- 4-  Knee flexion 5 5  Knee extension 5 5  Ankle dorsiflexion 5 5  Ankle plantarflexion 5 5  Ankle inversion    Ankle eversion    (* = pain; Blank rows = not tested)  Sensation Grossly intact to light touch throughout bilateral LEs as determined by testing dermatomes L2-S2.  Proprioception, stereognosis, and hot/cold testing deferred on this date.  Reflexes R/L Knee Jerk (L3/4): 2+/2+    Muscle Length Hamstrings: R: Positive for tight 35 deg L: negative  Palpation Location Right Left         Lumbar paraspinals    Quadratus Lumborum    Gluteus Maximus    Gluteus Medius 1   Deep hip external rotators 1   PSIS    Fortin's Area (SIJ)    Greater Trochanter 1   (Blank rows = not  tested) Graded on 0-4 scale (0 = no pain, 1 = pain, 2 = pain with wincing/grimacing/flinching, 3 = pain with withdrawal, 4 = unwilling to allow palpation)  Passive Accessory  Motion Pt denies reproduction of back pain with CPA L1-L5 and UPA bilaterally L1-L5. Generally, hypomobile throughout  Special Tests Lumbar Radiculopathy and Discogenic:  SLR (SN 92, -LR 0.29): R: Negative, pt endorsed tightness along posterior knee L:  Negative Crossed SLR (SP 90): R: Negative L: Negative  Hip: FABER (SN 81): R: Positive for glute tightness L: Negative FADIR (SN 94): R: Negative L: Negative Hip scour (SN 50): R: Negative L: Negative   Functional Tasks Deep squat: Narrow BoS, increased trunk flexion, bilateral knee pain   : 10.45 self selected = .95 m/s  5TSTS: 11.67s = (Mean: 50-59 years - 7.1s)  TODAY'S TREATMENT: DATE: 08/07/2024  Subjective: Patient reports 0/10 in her lower back. She tried her HEP however she felt nauseous, headache and mitigated with rest (~1 hour). No further questions or concerns.   30s STS: 20  Therapeutic Exercise:  Resisted Supine Bridge   3 x 10 - Blue TB   Supine 90/90 with LE extension   R/L: 1 x 20 ea leg    2 x 10 alternating LE  Supine 90/90 with OH reach  3 x 10 - 3 Kg MB  Repeated Lumbar Ext  15x - no improvements in lower back pain  Thoracic  thread the needle - Quadruped  R/L: 1 x 10  Hooklying Lumbar Rotation   2 x 10 - 2 Kg MB between legs  Hooklying Thoracic Open/Close Book  2 x 10 - More limited with reaching to the L, improved with addiitonal repetitions.   Therapeutic Activity: NuStep L5-1 x 5 min x LE (Seat 3) - pace partner mode > 85 SPM  to improve LE endurance and strength for increased walking capacity; PT manually adjusted resistance per patient tolerance.    KettleBell Squat   3 x 10 - 10# KB  PATIENT EDUCATION:  Education details: HEP, POC, Prognosis Person educated: Patient Education method: Explanation,  Demonstration, and Handouts Education comprehension: verbalized understanding, returned demonstration, and tactile cues required   HOME EXERCISE PROGRAM:  Access Code: [email or web address] URL: [email or web address] Date: 08/05/2024 Prepared by: Lonni Pall  Exercises - Supine Bridge  - 1 x daily - 3-4 x weekly - 2-3 sets - 10-12 reps - Straight Leg Raise  - 1 x daily - 3-4 x weekly - 2-3 sets - 10-12 reps - Supine 90/90 leg extensions  - 1 x daily - 3-4 x weekly - 2-3 sets - 10-12 reps - Cat Cow  - 2 x daily - 7 x weekly - 2-3 sets - 10 reps - Quadruped Full Range Thoracic Rotation with Reach  - 2 x daily - 7 x weekly - 2-3 sets - 10 reps   ASSESSMENT:  CLINICAL IMPRESSION: Patient arriving to first f/u in management of suspected lumbar radiculopathy with R sided  LE pain. Session focused on extension based exercises to tx lumbar radiculopathy and hip strengthening. She tolerated all activities focused on core strengthening; good ability to maintain neutral pelvis tilt during limb movements. Great demonstration of squat form with minimal cues for wider BoS and reduced forward trunk lean. One instance of patient reporting wooziness  following KB squats and she described a quick darkening in her visioning; mitigated with seated rest break. Patient pain has responded well to PT interventions and adherence to HEP. Based on today's performance, pt will continue to benefit from skilled PT in order to facilitate return to PLOF and improve QoL.    OBJECTIVE IMPAIRMENTS: decreased activity tolerance, decreased mobility, decreased strength, increased muscle spasms, postural dysfunction, and pain.   ACTIVITY LIMITATIONS: lifting, standing, squatting, and stairs  PARTICIPATION LIMITATIONS: community activity, occupation, and yard work  PERSONAL FACTORS: Age, Past/current experiences, Profession, and Time since onset of injury/illness/exacerbation are also affecting patient's functional  outcome.   REHAB POTENTIAL: Good  CLINICAL DECISION MAKING: Evolving/moderate complexity  EVALUATION COMPLEXITY: Moderate   GOALS: Goals reviewed with patient? No  SHORT TERM GOALS: Target date: 09/02/2024  Pt will be independent with HEP in order to improve strength and decrease back pain to improve pain-free function at home and work. Baseline: Initial HEP provided  Goal status: INITIAL   LONG TERM GOALS: Target date: 09/30/2024  Pt will increase 30s STS repetitions by 5 reps in order to demonstrate improvements in LE strength and endurance for ADLs.   Baseline: 20 Reps Goal status: INITIAL  2.  Pt will decrease worst back pain by at least 2 points on the NPRS in order to demonstrate clinically significant reduction in back pain. Baseline: 4/10 Goal status: INITIAL  3.  Pt will decrease mODI score by at least 13 points in order demonstrate clinically significant reduction in back pain/disability.       Baseline: TBD; 08/08/2023: 9 / 50 = 18.0 % Goal status: INITIAL  4.  Pt will be able to squat and lift 30# KB pain free with good technique in order to demonstrate improvements in biomechanics needed for lifting and squatting during ADLs. Baseline: TBD; 08/08/2023: able to lift 10# KB with good form  Goal status: INITIAL  5. Pt will increase by at least 0.13 m/s in order to demonstrate clinically significant improvement in community ambulation.  Baseline: 10.45 self selected = .95 m/s Goal status: INITIAL  6.  Pt will decrease 5TSTS by at least 3 seconds in order to demonstrate clinically significant improvement in LE strength Baseline: 11.67s Goal status: INITIAL  PLAN:  PT FREQUENCY: 1-2x/week  PT DURATION: 8 weeks  PLANNED INTERVENTIONS: [id number]- PT Re-evaluation, 97750- Physical Performance Testing, 97110-Therapeutic exercises, 97530- Therapeutic activity, 97112- Neuromuscular re-education, 97535- Self Care, [id number]- Manual therapy, Patient/Family education,  and Joint mobilization  PLAN FOR NEXT SESSION: Complete mODI, Initiate hip strengthening, core stability, thoracolumbar mobility     Lonni Pall PT, DPT Physical Therapist- [place]  08/07/2024, 3:23 PM  "

## 2024-08-12 ENCOUNTER — Ambulatory Visit

## 2024-08-12 DIAGNOSIS — M25551 Pain in right hip: Secondary | ICD-10-CM

## 2024-08-12 DIAGNOSIS — M5459 Other low back pain: Secondary | ICD-10-CM

## 2024-08-12 DIAGNOSIS — M6281 Muscle weakness (generalized): Secondary | ICD-10-CM

## 2024-08-12 NOTE — Therapy (Signed)
 " OUTPATIENT PHYSICAL THERAPY THORACOLUMBAR TREATMENT   Patient Name: Samantha Howell MRN: [id number] DOB:08-13-1965, 59 y.o., female Today's Date: 08/12/2024  END OF SESSION:  PT End of Session - 08/12/24 1520     Visit Number 3    Number of Visits 17    Date for Recertification  09/30/24    Authorization Type VL 60 PT/OT/SLP    Authorization - Visit Number 60    PT Start Time 1520    PT Stop Time 1605    PT Time Calculation (min) 45 min    Activity Tolerance Patient tolerated treatment well    Behavior During Therapy WFL for tasks assessed/performed            Past Medical History:  Diagnosis Date   Allergic rhinitis    Allergy    Anemia    history   Cataract    Chronic kidney disease    stones   Fibrocystic breast    GERD (gastroesophageal reflux disease)    HLD (hyperlipidemia)    diet controlled, no meds   Migraines    Past Surgical History:  Procedure Laterality Date   BREAST EXCISIONAL BIOPSY Left 2001   benign   BREAST LUMPECTOMY  07/1999   Left breast   CHOLECYSTECTOMY  06/13/2011   Procedure: LAPAROSCOPIC CHOLECYSTECTOMY WITH INTRAOPERATIVE CHOLANGIOGRAM;  Surgeon: Elon CHRISTELLA Pacini, MD;  Location: [facility];  Service: General;  Laterality: N/A;   COLONOSCOPY     TUBAL LIGATION  1992   WISDOM TOOTH EXTRACTION     Patient Active Problem List   Diagnosis Date Noted   Headache syndrome 03/13/2017   Routine general medical examination at a health care facility 05/23/2011   Hyperlipemia 01/04/2009   ELEVATED BLOOD PRESSURE WITHOUT DIAGNOSIS OF HYPERTENSION 01/27/2008   Seasonal allergic rhinitis due to pollen 12/16/2007   GERD 12/16/2007   FIBROCYSTIC BREAST DISEASE 12/16/2007    PCP: Jimmy Charlie FERNS, MD  REFERRING PROVIDER: Watt Mirza, MD  REFERRING DIAG:  M54.16 (ICD-10-CM) - Lumbar radiculopathy, right  M25.551,G89.29 (ICD-10-CM) - Chronic hip pain, right   RATIONALE FOR EVALUATION AND TREATMENT: Rehabilitation  THERAPY DIAG:    ONSET DATE: Chronic  FOLLOW-UP APPT SCHEDULED WITH REFERRING PROVIDER: No    SUBJECTIVE:                                                                                                                                                                                         SUBJECTIVE STATEMENT:    Patient is a 59 y.o. female presenting to OPPT with chief concern of lower back pain and hip pain.   PERTINENT HISTORY:   Samantha Howell is a 59 y.o. female presenting with lower back pain radiating into the lateral anterior hip, groin and thigh. She describes her R leg as restless and provoked physical activities including housework. Patient reports that's she has been trying to some stretches and exercises that Dr. Watt provided but the exacerbated the pain. She reports that she has to sit with a heating pad every day in order to mitigate the pain. She reports bilateral numbness and tingling in bilateral feet. She reports past history of tight IT band syndrome in the R side (resolved)and fibromas in her bilateral feet. She wears [employer] due to heel pain and to provide better foot support. Alleviating factors include tylenol  at night and a heating pad. Aggravating factors include: vacuuming, mopping, holding grand children (infant)   She denies numbness and tingling and surgical history of hip or lower back.  Red flags: Negative for bowel/bladder changes, saddle paresthesia   Imaging (Per Chart Review 06/11/2024):      (Lumbar Imaging):  EXAM: 4 VIEW(S) XRAY OF THE LUMBAR SPINE 06/11/2024 11:00:39 AM   COMPARISON: 05/31/2015   CLINICAL HISTORY: chronic back pain, R chronic back pain, R   FINDINGS:   LUMBAR SPINE: BONES: 5 lumbar-type vertebral bodies are well visualized. Osteophytic changes are noted. No pars defects are seen. No anterolisthesis is seen. No acute fracture.   DISCS AND DEGENERATIVE CHANGES: Mild facet hypertrophic changes are noted.   SOFT  TISSUES: No soft tissue abnormality is noted.   IMPRESSION: 1. No acute abnormality of the lumbar spine. 2. Mild facet hypertrophic changes.   Electronically signed by: Oneil Devonshire MD 06/13/2024 01:41 AM EST RP Workstation: [id number]  (Hip)  EXAM: 3 VIEW(S) XRAY OF THE BILATERAL HIP 06/11/2024 11:00:39 AM   COMPARISON: None available.   CLINICAL HISTORY: R hip pain, assess OA.   FINDINGS:   BONES AND JOINTS: No acute fracture or focal osseous lesion. Degenerative changes of the hip joints are noted bilaterally, right worse than left. Mild spurring of inferior SI joints.   LUMBAR SPINE: Degenerative changes in the lumbar spine are noted.   SOFT TISSUES: Surgical clip in left hemipelvis.   IMPRESSION: 1. Degenerative changes of the hip joints bilaterally, right worse than left. 2. Mild spurring of inferior SI joints. 3. Degenerative changes in the lumbar spine.   Electronically signed by: Oneil Devonshire MD 06/13/2024 01:42 AM EST RP Workstation: [id number]   PAIN:    Pain Intensity: Present: 1/10, Best: 0/10, Worst: 3-4/10 Pain location: Lower back, R hip Pain Quality: intermittent, dull, and aching  Radiating: Yes  Numbness/Tingling: No  PRECAUTIONS: None  WEIGHT BEARING RESTRICTIONS: No  FALLS: Has patient fallen in last 6 months? No  Living Environment Lives with: lives with their spouse Lives in: House/apartment Stairs: Yes: Internal: 13 steps; on left going up and External: 3-4 steps; can reach both Has following equipment at home: None  Prior level of function: Independent  Occupational demands: Office Job   Patient Goals: I would like to get stronger, flexible and more mobile    OBJECTIVE:  Patient Surveys  MODIFIED OSWESTRY DISABILITY SCALE  Date: 08/05/2024 Score  Pain intensity 1 = The pain is bad, but I can manage without having to take pain medication  2. Personal care (washing, dressing, etc.) 0 =  I can take care of myself normally  without causing increased pain.  3. Lifting 1 = I can lift heavy weights, but it causes increased pain.  4. Walking Not Answered**  5. Sitting 1 =  I can only sit in my favorite chair as long as I like.  6. Standing 1 =  I can stand as long as I want but, it increases my pain.  7. Sleeping 1 = I can sleep well only by using pain medication.  8. Social Life 1 =  My social life is normal, but it increases my level of pain.  9. Traveling 1 =  I can travel anywhere, but it increases my pain.  10. Employment/ Homemaking 1 = My normal homemaking/job activities increase my pain, but I can still perform all that is required of me  Total 8/50  ** = mODI incompleted - patient's score TBD  Interpretation of scores: Score Category Description  0-20% Minimal Disability The patient can cope with most living activities. Usually no treatment is indicated apart from advice on lifting, sitting and exercise  21-40% Moderate Disability The patient experiences more pain and difficulty with sitting, lifting and standing. Travel and social life are more difficult and they may be disabled from work. Personal care, sexual activity and sleeping are not grossly affected, and the patient can usually be managed by conservative means  41-60% Severe Disability Pain remains the main problem in this group, but activities of daily living are affected. These patients require a detailed investigation  61-80% Crippled Back pain impinges on all aspects of the patients life. Positive intervention is required  81-100% Bed-bound These patients are either bed-bound or exaggerating their symptoms  Bluford FORBES Zoe DELENA Karon DELENA, et al. Surgery versus conservative management of stable thoracolumbar fracture: the PRESTO feasibility RCT. [place] ([place]): [employer]; 2021 Nov. [facility] Technology Assessment, No. 25.62.) Appendix 3, Oswestry Disability Index category descriptors. Available from:  [email or web address].cz  Minimally Clinically Important Difference (MCID) = 12.8%   Cognition WNL    Gross Musculoskeletal Assessment Tremor: None Bulk: Normal Tone: Normal No visible step-off along spinal column, no signs of scoliosis  GAIT: Distance walked: 81m   Assistive device utilized: None Level of assistance: Complete Independence Comments: WNL, Reciprocal Gait    Posture: Lumbar lordosis: WNL Iliac crest height: Equal bilaterally Lumbar lateral shift: Negative  Seated: Rounded Shoulders, Increased Thoracic Kyphosis  AROM AROM (Normal range in degrees) AROM   Lumbar   Flexion (65) 100%  Extension (30) 100%  Right lateral flexion (25) 100%  Left lateral flexion (25) 100%*  Right rotation (30) 100%  Left rotation (30)       Hip Right Left  Flexion (125) WNL WNL  Extension (15)    Abduction (40)    Adduction     Internal Rotation (45) 25 25   External Rotation (45) WNL  WNL      Knee    Flexion (135)    Extension (0)        Ankle    Dorsiflexion (20)    Plantarflexion (50)    Inversion (35)    Eversion (15)    (* = pain; Blank rows = not tested)  LE MMT: MMT (out of 5) Right  Left   Hip flexion 4- 4-  Hip extension    Hip abduction 4- 4  Hip adduction (Hooklying) 5 5  Hip internal rotation 4- 4-  Hip external rotation 4- 4-  Knee flexion 5 5  Knee extension 5 5  Ankle dorsiflexion 5 5  Ankle plantarflexion 5 5  Ankle inversion    Ankle eversion    (* = pain; Blank rows = not tested)  Sensation Grossly intact to light touch throughout bilateral LEs as determined by testing dermatomes L2-S2. Proprioception, stereognosis, and hot/cold testing deferred on this date.  Reflexes R/L Knee Jerk (L3/4): 2+/2+    Muscle Length Hamstrings: R: Positive for tight 35 deg L: negative  Palpation Location Right Left         Lumbar paraspinals    Quadratus Lumborum    Gluteus Maximus    Gluteus Medius 1   Deep hip  external rotators 1   PSIS    Fortin's Area (SIJ)    Greater Trochanter 1   (Blank rows = not tested) Graded on 0-4 scale (0 = no pain, 1 = pain, 2 = pain with wincing/grimacing/flinching, 3 = pain with withdrawal, 4 = unwilling to allow palpation)  Passive Accessory  Motion Pt denies reproduction of back pain with CPA L1-L5 and UPA bilaterally L1-L5. Generally, hypomobile throughout  Special Tests Lumbar Radiculopathy and Discogenic:  SLR (SN 92, -LR 0.29): R: Negative, pt endorsed tightness along posterior knee L:  Negative Crossed SLR (SP 90): R: Negative L: Negative  Hip: FABER (SN 81): R: Positive for glute tightness L: Negative FADIR (SN 94): R: Negative L: Negative Hip scour (SN 50): R: Negative L: Negative   Functional Tasks Deep squat: Narrow BoS, increased trunk flexion, bilateral knee pain   : 10.45 self selected = .95 m/s  5TSTS: 11.67s = (Mean: 50-59 years - 7.1s)  TODAY'S TREATMENT: DATE: 08/12/2024  Subjective: Patient reports 1/10 NPS in the lR lower back/SI joint. Following last PT session she experienced moderate soreness (Friday). Patient reported that she started feeling IT band - like symptoms..  No further questions or concerns.   SI Joint Testing:  Thigh Thrust: + for concordant pain   Therapeutic Exercise:  Supine 90/90 with alternating LE march  2 x 15 marches  Thread the needle - quadruped   R/L: 1 x10    Catcow - Quadruped   1 x 10   Clamshell against resistance   R: 2 x 10    Lock Clamshell   1 x 10   R hip abduction   1 x 10 - AROM   2 x 10 - Green TB around thigh   R Piriformis Stretch  R: 30s/bout x 3 in order to improve muscle tension/tissue extensibility  R Side Plank with hip abduction for core stabilization and hip strength  3 x 10s hold  PT education on SI Joint, muscles attached to joint (deep gluteals and piriformis) and referral pattern for pain on SI Joint.   Therapeutic Activity: NuStep L3-1 x 3 min x LE  (Seat 3) - pace partner mode > 60 SPM  to improve LE endurance and strength for increased walking capacity; PT manually adjusted resistance per patient tolerance.    Lateral Stepping against resistance   4 x 12' - Blue TB around ankles   4 x 12' - Blue TB around ankles    PATIENT EDUCATION:  Education details: Exercise Technique  Person educated: Patient Education method: Explanation, Demonstration, and Handouts Education comprehension: verbalized understanding, returned demonstration, and tactile cues required   HOME EXERCISE PROGRAM:  Access Code: [email or web address] URL: [email or web address] Date: 08/12/2024 Prepared by: Lonni Pall  Exercises - Supine Bridge  - 1 x daily - 3-4 x weekly - 2-3 sets - 10-12 reps - Straight Leg Raise  - 1 x daily - 3-4 x weekly - 2-3 sets - 10-12 reps - Supine 90/90 leg extensions  - 1 x daily -  3-4 x weekly - 2-3 sets - 10-12 reps - Supine 90/90 Alternating Toe Touch  - 1 x daily - 7 x weekly - 3 sets - 10 reps - Cat Cow  - 2 x daily - 7 x weekly - 2-3 sets - 10 reps - Quadruped Full Range Thoracic Rotation with Reach  - 2 x daily - 7 x weekly - 2-3 sets - 10 reps - Supine Piriformis Stretch with Foot on Ground  - 2 x daily - 7 x weekly - 3 sets - 30s hold - Sidelying Hip Abduction with Resistance at Thighs  - 1 x daily - 7 x weekly - 2-3 sets - 10 reps   ASSESSMENT:  CLINICAL IMPRESSION: Continued PT POC in management of lower back pain with RLE radiating symptoms.  Subjective pain reported with + thigh thrust suggesting SI joint dysfunction. Time spent educating patient on SIJ, piriformis muscle attachments, referral patterns of SIJ.  Session focused lumbopelvic stability and mobilty exercises in order to decrease reported pain. Patient tolerated all exercises and educated on proper stretch protocol when she experieces similar pain. Next session to monitor response to today's session and continue with lumbopelvic stability and core  strengthening.  Based on today's performance, pt will continue to benefit from skilled PT in order to facilitate return to PLOF and improve QoL.  OBJECTIVE IMPAIRMENTS: decreased activity tolerance, decreased mobility, decreased strength, increased muscle spasms, postural dysfunction, and pain.   ACTIVITY LIMITATIONS: lifting, standing, squatting, and stairs  PARTICIPATION LIMITATIONS: community activity, occupation, and yard work  PERSONAL FACTORS: Age, Past/current experiences, Profession, and Time since onset of injury/illness/exacerbation are also affecting patient's functional outcome.   REHAB POTENTIAL: Good  CLINICAL DECISION MAKING: Evolving/moderate complexity  EVALUATION COMPLEXITY: Moderate   GOALS: Goals reviewed with patient? No  SHORT TERM GOALS: Target date: 09/02/2024  Pt will be independent with HEP in order to improve strength and decrease back pain to improve pain-free function at home and work. Baseline: Initial HEP provided  Goal status: INITIAL   LONG TERM GOALS: Target date: 09/30/2024  Pt will increase 30s STS repetitions by 5 reps in order to demonstrate improvements in LE strength and endurance for ADLs.   Baseline: 20 Reps Goal status: INITIAL  2.  Pt will decrease worst back pain by at least 2 points on the NPRS in order to demonstrate clinically significant reduction in back pain. Baseline: 4/10 Goal status: INITIAL  3.  Pt will decrease mODI score by at least 13 points in order demonstrate clinically significant reduction in back pain/disability.       Baseline: TBD; 08/08/2023: 9 / 50 = 18.0 % Goal status: INITIAL  4.  Pt will be able to squat and lift 30# KB pain free with good technique in order to demonstrate improvements in biomechanics needed for lifting and squatting during ADLs. Baseline: TBD; 08/08/2023: able to lift 10# KB with good form  Goal status: INITIAL  5. Pt will increase by at least 0.13 m/s in order to demonstrate  clinically significant improvement in community ambulation.  Baseline: 10.45 self selected = .95 m/s Goal status: INITIAL  6.  Pt will decrease 5TSTS by at least 3 seconds in order to demonstrate clinically significant improvement in LE strength Baseline: 11.67s Goal status: INITIAL  PLAN:  PT FREQUENCY: 1-2x/week  PT DURATION: 8 weeks  PLANNED INTERVENTIONS: [id number]- PT Re-evaluation, 97750- Physical Performance Testing, 97110-Therapeutic exercises, 97530- Therapeutic activity, [id number]- Neuromuscular re-education, 97535- Self Care, [id number]- Manual therapy, Patient/Family education,  and Joint mobilization  PLAN FOR NEXT SESSION:  Progress hip strengthening, core stability, thoracolumbar mobility     Lonni Pall PT, DPT Physical Therapist- [place]  08/12/2024, 4:16 PM  "

## 2024-08-14 ENCOUNTER — Ambulatory Visit

## 2024-08-14 DIAGNOSIS — M6281 Muscle weakness (generalized): Secondary | ICD-10-CM

## 2024-08-14 DIAGNOSIS — M5459 Other low back pain: Secondary | ICD-10-CM

## 2024-08-14 DIAGNOSIS — M25551 Pain in right hip: Secondary | ICD-10-CM

## 2024-08-14 NOTE — Therapy (Signed)
 " OUTPATIENT PHYSICAL THERAPY THORACOLUMBAR TREATMENT   Patient Name: Samantha Howell MRN: [id number] DOB:January 17, 1966, 59 y.o., female Today's Date: 08/14/2024  END OF SESSION:  PT End of Session - 08/14/24 1516     Visit Number 4    Number of Visits 17    Date for Recertification  09/30/24    Authorization Type VL 60 PT/OT/SLP    PT Start Time 1515    PT Stop Time 1555    PT Time Calculation (min) 40 min    Activity Tolerance Patient tolerated treatment well    Behavior During Therapy WFL for tasks assessed/performed             Past Medical History:  Diagnosis Date   Allergic rhinitis    Allergy    Anemia    history   Cataract    Chronic kidney disease    stones   Fibrocystic breast    GERD (gastroesophageal reflux disease)    HLD (hyperlipidemia)    diet controlled, no meds   Migraines    Past Surgical History:  Procedure Laterality Date   BREAST EXCISIONAL BIOPSY Left 2001   benign   BREAST LUMPECTOMY  07/1999   Left breast   CHOLECYSTECTOMY  06/13/2011   Procedure: LAPAROSCOPIC CHOLECYSTECTOMY WITH INTRAOPERATIVE CHOLANGIOGRAM;  Surgeon: Elon CHRISTELLA Pacini, MD;  Location: [facility];  Service: General;  Laterality: N/A;   COLONOSCOPY     TUBAL LIGATION  1992   WISDOM TOOTH EXTRACTION     Patient Active Problem List   Diagnosis Date Noted   Headache syndrome 03/13/2017   Routine general medical examination at a health care facility 05/23/2011   Hyperlipemia 01/04/2009   ELEVATED BLOOD PRESSURE WITHOUT DIAGNOSIS OF HYPERTENSION 01/27/2008   Seasonal allergic rhinitis due to pollen 12/16/2007   GERD 12/16/2007   FIBROCYSTIC BREAST DISEASE 12/16/2007    PCP: Jimmy Charlie FERNS, MD  REFERRING PROVIDER: Jimmy Charlie FERNS, MD  REFERRING DIAG:  M54.16 (ICD-10-CM) - Lumbar radiculopathy, right  M25.551,G89.29 (ICD-10-CM) - Chronic hip pain, right   RATIONALE FOR EVALUATION AND TREATMENT: Rehabilitation  THERAPY DIAG:   ONSET DATE: Chronic  FOLLOW-UP  APPT SCHEDULED WITH REFERRING PROVIDER: No    SUBJECTIVE:                                                                                                                                                                                         SUBJECTIVE STATEMENT:    Patient is a 59 y.o. female presenting to OPPT with chief concern of lower back pain and hip pain.   PERTINENT HISTORY:   Samantha Howell is a 59 y.o.  female presenting with lower back pain radiating into the lateral anterior hip, groin and thigh. She describes her R leg as restless and provoked physical activities including housework. Patient reports that's she has been trying to some stretches and exercises that Dr. Watt provided but the exacerbated the pain. She reports that she has to sit with a heating pad every day in order to mitigate the pain. She reports bilateral numbness and tingling in bilateral feet. She reports past history of tight IT band syndrome in the R side (resolved)and fibromas in her bilateral feet. She wears [employer] due to heel pain and to provide better foot support. Alleviating factors include tylenol  at night and a heating pad. Aggravating factors include: vacuuming, mopping, holding grand children (infant)   She denies numbness and tingling and surgical history of hip or lower back.  Red flags: Negative for bowel/bladder changes, saddle paresthesia   Imaging (Per Chart Review 06/11/2024):      (Lumbar Imaging):  EXAM: 4 VIEW(S) XRAY OF THE LUMBAR SPINE 06/11/2024 11:00:39 AM   COMPARISON: 05/31/2015   CLINICAL HISTORY: chronic back pain, R chronic back pain, R   FINDINGS:   LUMBAR SPINE: BONES: 5 lumbar-type vertebral bodies are well visualized. Osteophytic changes are noted. No pars defects are seen. No anterolisthesis is seen. No acute fracture.   DISCS AND DEGENERATIVE CHANGES: Mild facet hypertrophic changes are noted.   SOFT TISSUES: No soft tissue abnormality  is noted.   IMPRESSION: 1. No acute abnormality of the lumbar spine. 2. Mild facet hypertrophic changes.   Electronically signed by: Oneil Devonshire MD 06/13/2024 01:41 AM EST RP Workstation: [id number]  (Hip)  EXAM: 3 VIEW(S) XRAY OF THE BILATERAL HIP 06/11/2024 11:00:39 AM   COMPARISON: None available.   CLINICAL HISTORY: R hip pain, assess OA.   FINDINGS:   BONES AND JOINTS: No acute fracture or focal osseous lesion. Degenerative changes of the hip joints are noted bilaterally, right worse than left. Mild spurring of inferior SI joints.   LUMBAR SPINE: Degenerative changes in the lumbar spine are noted.   SOFT TISSUES: Surgical clip in left hemipelvis.   IMPRESSION: 1. Degenerative changes of the hip joints bilaterally, right worse than left. 2. Mild spurring of inferior SI joints. 3. Degenerative changes in the lumbar spine.   Electronically signed by: Oneil Devonshire MD 06/13/2024 01:42 AM EST RP Workstation: [id number]   PAIN:    Pain Intensity: Present: 1/10, Best: 0/10, Worst: 3-4/10 Pain location: Lower back, R hip Pain Quality: intermittent, dull, and aching  Radiating: Yes  Numbness/Tingling: No  PRECAUTIONS: None  WEIGHT BEARING RESTRICTIONS: No  FALLS: Has patient fallen in last 6 months? No  Living Environment Lives with: lives with their spouse Lives in: House/apartment Stairs: Yes: Internal: 13 steps; on left going up and External: 3-4 steps; can reach both Has following equipment at home: None  Prior level of function: Independent  Occupational demands: Office Job   Patient Goals: I would like to get stronger, flexible and more mobile    OBJECTIVE:  Patient Surveys  MODIFIED OSWESTRY DISABILITY SCALE  Date: 08/05/2024 Score  Pain intensity 1 = The pain is bad, but I can manage without having to take pain medication  2. Personal care (washing, dressing, etc.) 0 =  I can take care of myself normally without causing increased pain.  3.  Lifting 1 = I can lift heavy weights, but it causes increased pain.  4. Walking Not Answered**  5. Sitting 1 =  I can only sit in my favorite chair as long as I like.  6. Standing 1 =  I can stand as long as I want but, it increases my pain.  7. Sleeping 1 = I can sleep well only by using pain medication.  8. Social Life 1 =  My social life is normal, but it increases my level of pain.  9. Traveling 1 =  I can travel anywhere, but it increases my pain.  10. Employment/ Homemaking 1 = My normal homemaking/job activities increase my pain, but I can still perform all that is required of me  Total 8/50  ** = mODI incompleted - patient's score TBD  Interpretation of scores: Score Category Description  0-20% Minimal Disability The patient can cope with most living activities. Usually no treatment is indicated apart from advice on lifting, sitting and exercise  21-40% Moderate Disability The patient experiences more pain and difficulty with sitting, lifting and standing. Travel and social life are more difficult and they may be disabled from work. Personal care, sexual activity and sleeping are not grossly affected, and the patient can usually be managed by conservative means  41-60% Severe Disability Pain remains the main problem in this group, but activities of daily living are affected. These patients require a detailed investigation  61-80% Crippled Back pain impinges on all aspects of the patients life. Positive intervention is required  81-100% Bed-bound These patients are either bed-bound or exaggerating their symptoms  Bluford FORBES Zoe DELENA Karon DELENA, et al. Surgery versus conservative management of stable thoracolumbar fracture: the PRESTO feasibility RCT. [place] ([place]): [employer]; 2021 Nov. [facility] Technology Assessment, No. 25.62.) Appendix 3, Oswestry Disability Index category descriptors. Available from: Findjewelers.cz  Minimally Clinically  Important Difference (MCID) = 12.8%   Cognition WNL    Gross Musculoskeletal Assessment Tremor: None Bulk: Normal Tone: Normal No visible step-off along spinal column, no signs of scoliosis  GAIT: Distance walked: 37m   Assistive device utilized: None Level of assistance: Complete Independence Comments: WNL, Reciprocal Gait    Posture: Lumbar lordosis: WNL Iliac crest height: Equal bilaterally Lumbar lateral shift: Negative  Seated: Rounded Shoulders, Increased Thoracic Kyphosis  AROM AROM (Normal range in degrees) AROM   Lumbar   Flexion (65) 100%  Extension (30) 100%  Right lateral flexion (25) 100%  Left lateral flexion (25) 100%*  Right rotation (30) 100%  Left rotation (30)       Hip Right Left  Flexion (125) WNL WNL  Extension (15)    Abduction (40)    Adduction     Internal Rotation (45) 25 25   External Rotation (45) WNL  WNL      Knee    Flexion (135)    Extension (0)        Ankle    Dorsiflexion (20)    Plantarflexion (50)    Inversion (35)    Eversion (15)    (* = pain; Blank rows = not tested)  LE MMT: MMT (out of 5) Right  Left   Hip flexion 4- 4-  Hip extension    Hip abduction 4- 4  Hip adduction (Hooklying) 5 5  Hip internal rotation 4- 4-  Hip external rotation 4- 4-  Knee flexion 5 5  Knee extension 5 5  Ankle dorsiflexion 5 5  Ankle plantarflexion 5 5  Ankle inversion    Ankle eversion    (* = pain; Blank rows = not tested)  Sensation Grossly intact to  light touch throughout bilateral LEs as determined by testing dermatomes L2-S2. Proprioception, stereognosis, and hot/cold testing deferred on this date.  Reflexes R/L Knee Jerk (L3/4): 2+/2+    Muscle Length Hamstrings: R: Positive for tight 35 deg L: negative  Palpation Location Right Left         Lumbar paraspinals    Quadratus Lumborum    Gluteus Maximus    Gluteus Medius 1   Deep hip external rotators 1   PSIS    Fortin's Area (SIJ)    Greater Trochanter  1   (Blank rows = not tested) Graded on 0-4 scale (0 = no pain, 1 = pain, 2 = pain with wincing/grimacing/flinching, 3 = pain with withdrawal, 4 = unwilling to allow palpation)  Passive Accessory  Motion Pt denies reproduction of back pain with CPA L1-L5 and UPA bilaterally L1-L5. Generally, hypomobile throughout  Special Tests Lumbar Radiculopathy and Discogenic:  SLR (SN 92, -LR 0.29): R: Negative, pt endorsed tightness along posterior knee L:  Negative Crossed SLR (SP 90): R: Negative L: Negative  Hip: FABER (SN 81): R: Positive for glute tightness L: Negative FADIR (SN 94): R: Negative L: Negative Hip scour (SN 50): R: Negative L: Negative   Functional Tasks Deep squat: Narrow BoS, increased trunk flexion, bilateral knee pain   : 10.45 self selected = .95 m/s  5TSTS: 11.67s = (Mean: 50-59 years - 7.1s)  TODAY'S TREATMENT: DATE: 08/14/24  Subjective: Patient reports 1/10 NPS in the lR lower back/SI joint. She felt really good after last session and was able to perform HEP yesterday. No further questions or concerns.   Therapeutic Exercise:  Thread the needle - quadruped   R/L: 1 x10    Catcow - Quadruped   2 x 10   Thoracic Open/Close Book - Supine   1 x 10 - alternating dir  Supine 90/90 with alternating LE march  2 x 20 rep   Supine 90/90 Isohold (ball squeeze against green physioball with BUE/LUE )   3 x 15s hold   R Side Plank with hip abduction for core stabilization and hip strength  2 x 10s hold  R Side Plank   2 x 10s  Therapeutic Activity: NuStep L3-2 x 6 min x LE (Seat 3) - pace partner mode > 70 SPM  to improve LE endurance and strength for increased walking capacity; PT manually adjusted resistance per patient tolerance.    Lateral Stepping against resistance   4 x 12' - Blue TB around ankles   4 x 12' - Blue TB around ankles   Kettlebell Squat  2 x 10 - 20# KB   PATIENT EDUCATION:  Education details: Exercise Technique  Person  educated: Patient Education method: Explanation, Demonstration, and Handouts Education comprehension: verbalized understanding, returned demonstration, and tactile cues required   HOME EXERCISE PROGRAM:  Access Code: [email or web address] URL: [email or web address] Date: 08/12/2024 Prepared by: Lonni Pall  Exercises - Supine Bridge  - 1 x daily - 3-4 x weekly - 2-3 sets - 10-12 reps - Straight Leg Raise  - 1 x daily - 3-4 x weekly - 2-3 sets - 10-12 reps - Supine 90/90 leg extensions  - 1 x daily - 3-4 x weekly - 2-3 sets - 10-12 reps - Supine 90/90 Alternating Toe Touch  - 1 x daily - 7 x weekly - 3 sets - 10 reps - Cat Cow  - 2 x daily - 7 x weekly - 2-3 sets - 10 reps - Quadruped Full  Range Thoracic Rotation with Reach  - 2 x daily - 7 x weekly - 2-3 sets - 10 reps - Supine Piriformis Stretch with Foot on Ground  - 2 x daily - 7 x weekly - 3 sets - 30s hold - Sidelying Hip Abduction with Resistance at Thighs  - 1 x daily - 7 x weekly - 2-3 sets - 10 reps   ASSESSMENT:  CLINICAL IMPRESSION: Continued PT POC in management of lower back pain with RLE radiating symptoms. Continued focus on improving thoracolumbar mobility and core strengthening. Good tolerance to all PT interventions wihtout exacerbation of lower back pain. Good carryover from previous session for squat form against resistance with minimal cues from PT.  Reviewed HEP and proper frequency for the weekend. PT to continue progressing intensity as patient tolerates.The patient still presents with intermittent pain limiting her full participation with recreational activities and ADLs.  Based on today's performance, pt will continue to benefit from skilled PT in order to facilitate return to PLOF and improve QoL.  OBJECTIVE IMPAIRMENTS: decreased activity tolerance, decreased mobility, decreased strength, increased muscle spasms, postural dysfunction, and pain.   ACTIVITY LIMITATIONS: lifting, standing, squatting, and  stairs  PARTICIPATION LIMITATIONS: community activity, occupation, and yard work  PERSONAL FACTORS: Age, Past/current experiences, Profession, and Time since onset of injury/illness/exacerbation are also affecting patient's functional outcome.   REHAB POTENTIAL: Good  CLINICAL DECISION MAKING: Evolving/moderate complexity  EVALUATION COMPLEXITY: Moderate   GOALS: Goals reviewed with patient? No  SHORT TERM GOALS: Target date: 09/02/2024  Pt will be independent with HEP in order to improve strength and decrease back pain to improve pain-free function at home and work. Baseline: Initial HEP provided  Goal status: INITIAL   LONG TERM GOALS: Target date: 09/30/2024  Pt will increase 30s STS repetitions by 5 reps in order to demonstrate improvements in LE strength and endurance for ADLs.   Baseline: 20 Reps Goal status: INITIAL  2.  Pt will decrease worst back pain by at least 2 points on the NPRS in order to demonstrate clinically significant reduction in back pain. Baseline: 4/10 Goal status: INITIAL  3.  Pt will decrease mODI score by at least 13 points in order demonstrate clinically significant reduction in back pain/disability.       Baseline: TBD; 08/08/2023: 9 / 50 = 18.0 % Goal status: INITIAL  4.  Pt will be able to squat and lift 30# KB pain free with good technique in order to demonstrate improvements in biomechanics needed for lifting and squatting during ADLs. Baseline: TBD; 08/08/2023: able to lift 10# KB with good form  Goal status: INITIAL  5. Pt will increase by at least 0.13 m/s in order to demonstrate clinically significant improvement in community ambulation.  Baseline: 10.45 self selected = .95 m/s Goal status: INITIAL  6.  Pt will decrease 5TSTS by at least 3 seconds in order to demonstrate clinically significant improvement in LE strength Baseline: 11.67s Goal status: INITIAL  PLAN:  PT FREQUENCY: 1-2x/week  PT DURATION: 8 weeks  PLANNED  INTERVENTIONS: 97164- PT Re-evaluation, 97750- Physical Performance Testing, 97110-Therapeutic exercises, 97530- Therapeutic activity, 97112- Neuromuscular re-education, 97535- Self Care, [id number]- Manual therapy, Patient/Family education, and Joint mobilization  PLAN FOR NEXT SESSION:  Progress hip strengthening, core stability, thoracolumbar mobility     Lonni Pall PT, DPT Physical Therapist- [place]  08/14/2024, 3:17 PM  "

## 2024-08-19 ENCOUNTER — Ambulatory Visit

## 2024-08-19 ENCOUNTER — Telehealth: Payer: Self-pay

## 2024-08-19 NOTE — Telephone Encounter (Signed)
 Called patient about missed appointment. She reported that she called to cancel but had to leave a voicemail. Unable to arrive to visit due to meeting. Advised of the following appointment and pt agreeable to appt time.  Lonni Pall PT, DPT Physical Therapist- [place]

## 2024-08-21 ENCOUNTER — Ambulatory Visit

## 2024-08-21 DIAGNOSIS — M6281 Muscle weakness (generalized): Secondary | ICD-10-CM

## 2024-08-21 DIAGNOSIS — M25551 Pain in right hip: Secondary | ICD-10-CM

## 2024-08-21 DIAGNOSIS — M5459 Other low back pain: Secondary | ICD-10-CM

## 2024-08-21 NOTE — Therapy (Signed)
 " OUTPATIENT PHYSICAL THERAPY THORACOLUMBAR TREATMENT   Patient Name: Samantha Howell MRN: [id number] DOB:03/10/1966, 59 y.o., female Today's Date: 08/21/2024  END OF SESSION:  PT End of Session - 08/21/24 0950     Visit Number 5    Number of Visits 17    Date for Recertification  09/30/24    Authorization Type VL 60 PT/OT/SLP    PT Start Time 0945    PT Stop Time 1025    PT Time Calculation (min) 40 min    Activity Tolerance Patient tolerated treatment well    Behavior During Therapy WFL for tasks assessed/performed             Past Medical History:  Diagnosis Date   Allergic rhinitis    Allergy    Anemia    history   Cataract    Chronic kidney disease    stones   Fibrocystic breast    GERD (gastroesophageal reflux disease)    HLD (hyperlipidemia)    diet controlled, no meds   Migraines    Past Surgical History:  Procedure Laterality Date   BREAST EXCISIONAL BIOPSY Left 2001   benign   BREAST LUMPECTOMY  07/1999   Left breast   CHOLECYSTECTOMY  06/13/2011   Procedure: LAPAROSCOPIC CHOLECYSTECTOMY WITH INTRAOPERATIVE CHOLANGIOGRAM;  Surgeon: Elon CHRISTELLA Pacini, MD;  Location: [facility];  Service: General;  Laterality: N/A;   COLONOSCOPY     TUBAL LIGATION  1992   WISDOM TOOTH EXTRACTION     Patient Active Problem List   Diagnosis Date Noted   Headache syndrome 03/13/2017   Routine general medical examination at a health care facility 05/23/2011   Hyperlipemia 01/04/2009   ELEVATED BLOOD PRESSURE WITHOUT DIAGNOSIS OF HYPERTENSION 01/27/2008   Seasonal allergic rhinitis due to pollen 12/16/2007   GERD 12/16/2007   FIBROCYSTIC BREAST DISEASE 12/16/2007    PCP: Jimmy Charlie FERNS, MD  REFERRING PROVIDER: Watt Mirza, MD  REFERRING DIAG:  M54.16 (ICD-10-CM) - Lumbar radiculopathy, right  M25.551,G89.29 (ICD-10-CM) - Chronic hip pain, right   RATIONALE FOR EVALUATION AND TREATMENT: Rehabilitation  THERAPY DIAG:   ONSET DATE: Chronic  FOLLOW-UP APPT  SCHEDULED WITH REFERRING PROVIDER: No    SUBJECTIVE:                                                                                                                                                                                         SUBJECTIVE STATEMENT:    Patient is a 59 y.o. female presenting to OPPT with chief concern of lower back pain and hip pain.   PERTINENT HISTORY:   Samantha Howell is a 58 y.o. female  presenting with lower back pain radiating into the lateral anterior hip, groin and thigh. She describes her R leg as restless and provoked physical activities including housework. Patient reports that's she has been trying to some stretches and exercises that Dr. Watt provided but the exacerbated the pain. She reports that she has to sit with a heating pad every day in order to mitigate the pain. She reports bilateral numbness and tingling in bilateral feet. She reports past history of tight IT band syndrome in the R side (resolved)and fibromas in her bilateral feet. She wears [employer] due to heel pain and to provide better foot support. Alleviating factors include tylenol  at night and a heating pad. Aggravating factors include: vacuuming, mopping, holding grand children (infant)   She denies numbness and tingling and surgical history of hip or lower back.  Red flags: Negative for bowel/bladder changes, saddle paresthesia   Imaging (Per Chart Review 06/11/2024):      (Lumbar Imaging):  EXAM: 4 VIEW(S) XRAY OF THE LUMBAR SPINE 06/11/2024 11:00:39 AM   COMPARISON: 05/31/2015   CLINICAL HISTORY: chronic back pain, R chronic back pain, R   FINDINGS:   LUMBAR SPINE: BONES: 5 lumbar-type vertebral bodies are well visualized. Osteophytic changes are noted. No pars defects are seen. No anterolisthesis is seen. No acute fracture.   DISCS AND DEGENERATIVE CHANGES: Mild facet hypertrophic changes are noted.   SOFT TISSUES: No soft tissue abnormality is  noted.   IMPRESSION: 1. No acute abnormality of the lumbar spine. 2. Mild facet hypertrophic changes.   Electronically signed by: Oneil Devonshire MD 06/13/2024 01:41 AM EST RP Workstation: [id number]  (Hip)  EXAM: 3 VIEW(S) XRAY OF THE BILATERAL HIP 06/11/2024 11:00:39 AM   COMPARISON: None available.   CLINICAL HISTORY: R hip pain, assess OA.   FINDINGS:   BONES AND JOINTS: No acute fracture or focal osseous lesion. Degenerative changes of the hip joints are noted bilaterally, right worse than left. Mild spurring of inferior SI joints.   LUMBAR SPINE: Degenerative changes in the lumbar spine are noted.   SOFT TISSUES: Surgical clip in left hemipelvis.   IMPRESSION: 1. Degenerative changes of the hip joints bilaterally, right worse than left. 2. Mild spurring of inferior SI joints. 3. Degenerative changes in the lumbar spine.   Electronically signed by: Oneil Devonshire MD 06/13/2024 01:42 AM EST RP Workstation: [id number]   PAIN:    Pain Intensity: Present: 1/10, Best: 0/10, Worst: 3-4/10 Pain location: Lower back, R hip Pain Quality: intermittent, dull, and aching  Radiating: Yes  Numbness/Tingling: No  PRECAUTIONS: None  WEIGHT BEARING RESTRICTIONS: No  FALLS: Has patient fallen in last 6 months? No  Living Environment Lives with: lives with their spouse Lives in: House/apartment Stairs: Yes: Internal: 13 steps; on left going up and External: 3-4 steps; can reach both Has following equipment at home: None  Prior level of function: Independent  Occupational demands: Office Job   Patient Goals: I would like to get stronger, flexible and more mobile    OBJECTIVE:  Patient Surveys  MODIFIED OSWESTRY DISABILITY SCALE  Date: 08/05/2024 Score  Pain intensity 1 = The pain is bad, but I can manage without having to take pain medication  2. Personal care (washing, dressing, etc.) 0 =  I can take care of myself normally without causing increased pain.  3.  Lifting 1 = I can lift heavy weights, but it causes increased pain.  4. Walking Not Answered**  5. Sitting 1 =  I  can only sit in my favorite chair as long as I like.  6. Standing 1 =  I can stand as long as I want but, it increases my pain.  7. Sleeping 1 = I can sleep well only by using pain medication.  8. Social Life 1 =  My social life is normal, but it increases my level of pain.  9. Traveling 1 =  I can travel anywhere, but it increases my pain.  10. Employment/ Homemaking 1 = My normal homemaking/job activities increase my pain, but I can still perform all that is required of me  Total 8/50  ** = mODI incompleted - patient's score TBD  Interpretation of scores: Score Category Description  0-20% Minimal Disability The patient can cope with most living activities. Usually no treatment is indicated apart from advice on lifting, sitting and exercise  21-40% Moderate Disability The patient experiences more pain and difficulty with sitting, lifting and standing. Travel and social life are more difficult and they may be disabled from work. Personal care, sexual activity and sleeping are not grossly affected, and the patient can usually be managed by conservative means  41-60% Severe Disability Pain remains the main problem in this group, but activities of daily living are affected. These patients require a detailed investigation  61-80% Crippled Back pain impinges on all aspects of the patients life. Positive intervention is required  81-100% Bed-bound These patients are either bed-bound or exaggerating their symptoms  Bluford FORBES Zoe DELENA Karon DELENA, et al. Surgery versus conservative management of stable thoracolumbar fracture: the PRESTO feasibility RCT. [place] ([place]): [employer]; 2021 Nov. [facility] Technology Assessment, No. 25.62.) Appendix 3, Oswestry Disability Index category descriptors. Available from: Findjewelers.cz  Minimally Clinically  Important Difference (MCID) = 12.8%   Cognition WNL    Gross Musculoskeletal Assessment Tremor: None Bulk: Normal Tone: Normal No visible step-off along spinal column, no signs of scoliosis  GAIT: Distance walked: 41m   Assistive device utilized: None Level of assistance: Complete Independence Comments: WNL, Reciprocal Gait    Posture: Lumbar lordosis: WNL Iliac crest height: Equal bilaterally Lumbar lateral shift: Negative  Seated: Rounded Shoulders, Increased Thoracic Kyphosis  AROM AROM (Normal range in degrees) AROM   Lumbar   Flexion (65) 100%  Extension (30) 100%  Right lateral flexion (25) 100%  Left lateral flexion (25) 100%*  Right rotation (30) 100%  Left rotation (30)       Hip Right Left  Flexion (125) WNL WNL  Extension (15)    Abduction (40)    Adduction     Internal Rotation (45) 25 25   External Rotation (45) WNL  WNL      Knee    Flexion (135)    Extension (0)        Ankle    Dorsiflexion (20)    Plantarflexion (50)    Inversion (35)    Eversion (15)    (* = pain; Blank rows = not tested)  LE MMT: MMT (out of 5) Right  Left   Hip flexion 4- 4-  Hip extension    Hip abduction 4- 4  Hip adduction (Hooklying) 5 5  Hip internal rotation 4- 4-  Hip external rotation 4- 4-  Knee flexion 5 5  Knee extension 5 5  Ankle dorsiflexion 5 5  Ankle plantarflexion 5 5  Ankle inversion    Ankle eversion    (* = pain; Blank rows = not tested)  Sensation Grossly intact to light  touch throughout bilateral LEs as determined by testing dermatomes L2-S2. Proprioception, stereognosis, and hot/cold testing deferred on this date.  Reflexes R/L Knee Jerk (L3/4): 2+/2+    Muscle Length Hamstrings: R: Positive for tight 35 deg L: negative  Palpation Location Right Left         Lumbar paraspinals    Quadratus Lumborum    Gluteus Maximus    Gluteus Medius 1   Deep hip external rotators 1   PSIS    Fortin's Area (SIJ)    Greater Trochanter  1   (Blank rows = not tested) Graded on 0-4 scale (0 = no pain, 1 = pain, 2 = pain with wincing/grimacing/flinching, 3 = pain with withdrawal, 4 = unwilling to allow palpation)  Passive Accessory  Motion Pt denies reproduction of back pain with CPA L1-L5 and UPA bilaterally L1-L5. Generally, hypomobile throughout  Special Tests Lumbar Radiculopathy and Discogenic:  SLR (SN 92, -LR 0.29): R: Negative, pt endorsed tightness along posterior knee L:  Negative Crossed SLR (SP 90): R: Negative L: Negative  Hip: FABER (SN 81): R: Positive for glute tightness L: Negative FADIR (SN 94): R: Negative L: Negative Hip scour (SN 50): R: Negative L: Negative   Functional Tasks Deep squat: Narrow BoS, increased trunk flexion, bilateral knee pain   : 10.45 self selected = .95 m/s  5TSTS: 11.67s = (Mean: 50-59 years - 7.1s)  TODAY'S TREATMENT: DATE: 08/21/24  Subjective: Patient reports that this past weekend she was very mobile and volunteered at a food pantry (involved a lot bending, reaching and repetitive). She reports her back felt tired but she didn't have any radiating pain. No further questions or concerns.   Therapeutic Exercise:  Supine 90/90 with OH Reach   3 x 10 - Weight dowel   Supine Torso Rotation against resistance   3 x 10 - alternating dir, 2 Kg MB in b/t legs  Supine Glute Crossover Stretch   R: 30s/bout x 3 in order to improve muscle tension/tissue extensibility  Supine Piriformis Stretch   R: 30s/bout x 3 in order to improve muscle tension/tissue extensibility  Supine Lumbar Rotation Stretch   R: 30s/bout x 3 in order to improve muscle tension/tissue extensibility  Therapeutic Activity: NuStep L3-2 x 6 min x LE (Seat 3) - pace partner mode > 60 SPM  to improve LE endurance and strength for increased walking capacity; PT manually adjusted resistance per patient tolerance.    Sled Push for hip extensor strength and core strength with shopping   2 x 73m - 25#  added  4 x 17m - 50# added   Single leg RDL   R: 3 x 10 - a little wobbly   L: 2 x 10 - 5#, 1 x 10 10# KB  Kettlebell Swing   2 x 10 - 10# - mod cues for proper coordination between hip ext and TA brace.  1 x 15 - 10# KB - improved coordination with less lumbar hyper extnesion  Kettlebell Squat  2 x 10 - 20# KB   PATIENT EDUCATION:  Education details: Exercise Technique  Person educated: Patient Education method: Explanation, Demonstration, and Handouts Education comprehension: verbalized understanding, returned demonstration, and tactile cues required   HOME EXERCISE PROGRAM:  Access Code: [email or web address] URL: [email or web address] Date: 08/12/2024 Prepared by: Lonni Pall  Exercises - Supine Bridge  - 1 x daily - 3-4 x weekly - 2-3 sets - 10-12 reps - Straight Leg Raise  - 1 x daily - 3-4 x  weekly - 2-3 sets - 10-12 reps - Supine 90/90 leg extensions  - 1 x daily - 3-4 x weekly - 2-3 sets - 10-12 reps - Supine 90/90 Alternating Toe Touch  - 1 x daily - 7 x weekly - 3 sets - 10 reps - Cat Cow  - 2 x daily - 7 x weekly - 2-3 sets - 10 reps - Quadruped Full Range Thoracic Rotation with Reach  - 2 x daily - 7 x weekly - 2-3 sets - 10 reps - Supine Piriformis Stretch with Foot on Ground  - 2 x daily - 7 x weekly - 3 sets - 30s hold - Sidelying Hip Abduction with Resistance at Thighs  - 1 x daily - 7 x weekly - 2-3 sets - 10 reps   ASSESSMENT:  CLINICAL IMPRESSION: Continued PT POC in management of lower back pain with RLE radiating symptoms. Continued focus on improving thoracolumbar mobility and core strengthening. Good tolerance to all PT interventions wihtout exacerbation of lower back pain. Increased intensity with additional compound exercises targeting coordination between core stability and hip muscles. Patient with mod cues for proper pendulum motion with KB swings;  improved coordination and technique with additional repetitions. Passive stretches applied to  patient in order to decrease muscle tension in the glute max and lower lumbar spine. The patient still presents with intermittent pain limiting her full participation with recreational activities and ADLs.  Based on today's performance, pt will continue to benefit from skilled PT in order to facilitate return to PLOF and improve QoL.  OBJECTIVE IMPAIRMENTS: decreased activity tolerance, decreased mobility, decreased strength, increased muscle spasms, postural dysfunction, and pain.   ACTIVITY LIMITATIONS: lifting, standing, squatting, and stairs  PARTICIPATION LIMITATIONS: community activity, occupation, and yard work  PERSONAL FACTORS: Age, Past/current experiences, Profession, and Time since onset of injury/illness/exacerbation are also affecting patient's functional outcome.   REHAB POTENTIAL: Good  CLINICAL DECISION MAKING: Evolving/moderate complexity  EVALUATION COMPLEXITY: Moderate   GOALS: Goals reviewed with patient? No  SHORT TERM GOALS: Target date: 09/02/2024  Pt will be independent with HEP in order to improve strength and decrease back pain to improve pain-free function at home and work. Baseline: Initial HEP provided  Goal status: INITIAL   LONG TERM GOALS: Target date: 09/30/2024  Pt will increase 30s STS repetitions by 5 reps in order to demonstrate improvements in LE strength and endurance for ADLs.   Baseline: 20 Reps Goal status: INITIAL  2.  Pt will decrease worst back pain by at least 2 points on the NPRS in order to demonstrate clinically significant reduction in back pain. Baseline: 4/10 Goal status: INITIAL  3.  Pt will decrease mODI score by at least 13 points in order demonstrate clinically significant reduction in back pain/disability.       Baseline: TBD; 08/08/2023: 9 / 50 = 18.0 % Goal status: INITIAL  4.  Pt will be able to squat and lift 30# KB pain free with good technique in order to demonstrate improvements in biomechanics needed for  lifting and squatting during ADLs. Baseline: TBD; 08/08/2023: able to lift 10# KB with good form  Goal status: INITIAL  5. Pt will increase by at least 0.13 m/s in order to demonstrate clinically significant improvement in community ambulation.  Baseline: 10.45 self selected = .95 m/s Goal status: INITIAL  6.  Pt will decrease 5TSTS by at least 3 seconds in order to demonstrate clinically significant improvement in LE strength Baseline: 11.67s Goal status: INITIAL  PLAN:  PT FREQUENCY: 1-2x/week  PT DURATION: 8 weeks  PLANNED INTERVENTIONS: 97164- PT Re-evaluation, 97750- Physical Performance Testing, 97110-Therapeutic exercises, 97530- Therapeutic activity, 97112- Neuromuscular re-education, 97535- Self Care, [id number]- Manual therapy, Patient/Family education, and Joint mobilization  PLAN FOR NEXT SESSION:  Progress hip strengthening, core stability, thoracolumbar mobility     Lonni Pall PT, DPT Physical Therapist- [place]  08/21/2024, 9:53 AM  "

## 2024-08-25 ENCOUNTER — Ambulatory Visit

## 2024-08-27 ENCOUNTER — Ambulatory Visit

## 2024-08-27 DIAGNOSIS — M5459 Other low back pain: Secondary | ICD-10-CM

## 2024-08-27 DIAGNOSIS — M25551 Pain in right hip: Secondary | ICD-10-CM

## 2024-08-27 DIAGNOSIS — M6281 Muscle weakness (generalized): Secondary | ICD-10-CM

## 2024-08-27 NOTE — Therapy (Signed)
 " OUTPATIENT PHYSICAL THERAPY THORACOLUMBAR TREATMENT   Patient Name: Samantha Howell MRN: [id number] DOB:1966-06-25, 59 y.o., female Today's Date: 08/27/2024  END OF SESSION:  PT End of Session - 08/27/24 1120     Visit Number 6    Number of Visits 17    Date for Recertification  09/30/24    Authorization Type VL 60 PT/OT/SLP    PT Start Time 1120    PT Stop Time 1203    PT Time Calculation (min) 43 min    Activity Tolerance Patient tolerated treatment well    Behavior During Therapy WFL for tasks assessed/performed             Past Medical History:  Diagnosis Date   Allergic rhinitis    Allergy    Anemia    history   Cataract    Chronic kidney disease    stones   Fibrocystic breast    GERD (gastroesophageal reflux disease)    HLD (hyperlipidemia)    diet controlled, no meds   Migraines    Past Surgical History:  Procedure Laterality Date   BREAST EXCISIONAL BIOPSY Left 2001   benign   BREAST LUMPECTOMY  07/1999   Left breast   CHOLECYSTECTOMY  06/13/2011   Procedure: LAPAROSCOPIC CHOLECYSTECTOMY WITH INTRAOPERATIVE CHOLANGIOGRAM;  Surgeon: Elon CHRISTELLA Pacini, MD;  Location: [facility];  Service: General;  Laterality: N/A;   COLONOSCOPY     TUBAL LIGATION  1992   WISDOM TOOTH EXTRACTION     Patient Active Problem List   Diagnosis Date Noted   Headache syndrome 03/13/2017   Routine general medical examination at a health care facility 05/23/2011   Hyperlipemia 01/04/2009   ELEVATED BLOOD PRESSURE WITHOUT DIAGNOSIS OF HYPERTENSION 01/27/2008   Seasonal allergic rhinitis due to pollen 12/16/2007   GERD 12/16/2007   FIBROCYSTIC BREAST DISEASE 12/16/2007    PCP: Jimmy Charlie FERNS, MD  REFERRING PROVIDER: Watt Mirza, MD  REFERRING DIAG:  M54.16 (ICD-10-CM) - Lumbar radiculopathy, right  M25.551,G89.29 (ICD-10-CM) - Chronic hip pain, right   RATIONALE FOR EVALUATION AND TREATMENT: Rehabilitation  THERAPY DIAG:   ONSET DATE: Chronic  FOLLOW-UP APPT  SCHEDULED WITH REFERRING PROVIDER: No    SUBJECTIVE:                                                                                                                                                                                         SUBJECTIVE STATEMENT:    Patient is a 59 y.o. female presenting to OPPT with chief concern of lower back pain and hip pain.   PERTINENT HISTORY:   Samantha Howell is a 59 y.o. female  presenting with lower back pain radiating into the lateral anterior hip, groin and thigh. She describes her R leg as restless and provoked physical activities including housework. Patient reports that's she has been trying to some stretches and exercises that Dr. Watt provided but the exacerbated the pain. She reports that she has to sit with a heating pad every day in order to mitigate the pain. She reports bilateral numbness and tingling in bilateral feet. She reports past history of tight IT band syndrome in the R side (resolved)and fibromas in her bilateral feet. She wears [employer] due to heel pain and to provide better foot support. Alleviating factors include tylenol  at night and a heating pad. Aggravating factors include: vacuuming, mopping, holding grand children (infant)   She denies numbness and tingling and surgical history of hip or lower back.  Red flags: Negative for bowel/bladder changes, saddle paresthesia   Imaging (Per Chart Review 06/11/2024):      (Lumbar Imaging):  EXAM: 4 VIEW(S) XRAY OF THE LUMBAR SPINE 06/11/2024 11:00:39 AM   COMPARISON: 05/31/2015   CLINICAL HISTORY: chronic back pain, R chronic back pain, R   FINDINGS:   LUMBAR SPINE: BONES: 5 lumbar-type vertebral bodies are well visualized. Osteophytic changes are noted. No pars defects are seen. No anterolisthesis is seen. No acute fracture.   DISCS AND DEGENERATIVE CHANGES: Mild facet hypertrophic changes are noted.   SOFT TISSUES: No soft tissue abnormality is  noted.   IMPRESSION: 1. No acute abnormality of the lumbar spine. 2. Mild facet hypertrophic changes.   Electronically signed by: Oneil Devonshire MD 06/13/2024 01:41 AM EST RP Workstation: [id number]  (Hip)  EXAM: 3 VIEW(S) XRAY OF THE BILATERAL HIP 06/11/2024 11:00:39 AM   COMPARISON: None available.   CLINICAL HISTORY: R hip pain, assess OA.   FINDINGS:   BONES AND JOINTS: No acute fracture or focal osseous lesion. Degenerative changes of the hip joints are noted bilaterally, right worse than left. Mild spurring of inferior SI joints.   LUMBAR SPINE: Degenerative changes in the lumbar spine are noted.   SOFT TISSUES: Surgical clip in left hemipelvis.   IMPRESSION: 1. Degenerative changes of the hip joints bilaterally, right worse than left. 2. Mild spurring of inferior SI joints. 3. Degenerative changes in the lumbar spine.   Electronically signed by: Oneil Devonshire MD 06/13/2024 01:42 AM EST RP Workstation: [id number]   PAIN:    Pain Intensity: Present: 1/10, Best: 0/10, Worst: 3-4/10 Pain location: Lower back, R hip Pain Quality: intermittent, dull, and aching  Radiating: Yes  Numbness/Tingling: No  PRECAUTIONS: None  WEIGHT BEARING RESTRICTIONS: No  FALLS: Has patient fallen in last 6 months? No  Living Environment Lives with: lives with their spouse Lives in: House/apartment Stairs: Yes: Internal: 13 steps; on left going up and External: 3-4 steps; can reach both Has following equipment at home: None  Prior level of function: Independent  Occupational demands: Office Job   Patient Goals: I would like to get stronger, flexible and more mobile    OBJECTIVE:  Patient Surveys  MODIFIED OSWESTRY DISABILITY SCALE  Date: 08/05/2024 Score  Pain intensity 1 = The pain is bad, but I can manage without having to take pain medication  2. Personal care (washing, dressing, etc.) 0 =  I can take care of myself normally without causing increased pain.  3.  Lifting 1 = I can lift heavy weights, but it causes increased pain.  4. Walking Not Answered**  5. Sitting 1 =  I  can only sit in my favorite chair as long as I like.  6. Standing 1 =  I can stand as long as I want but, it increases my pain.  7. Sleeping 1 = I can sleep well only by using pain medication.  8. Social Life 1 =  My social life is normal, but it increases my level of pain.  9. Traveling 1 =  I can travel anywhere, but it increases my pain.  10. Employment/ Homemaking 1 = My normal homemaking/job activities increase my pain, but I can still perform all that is required of me  Total 8/50  ** = mODI incompleted - patient's score TBD  Interpretation of scores: Score Category Description  0-20% Minimal Disability The patient can cope with most living activities. Usually no treatment is indicated apart from advice on lifting, sitting and exercise  21-40% Moderate Disability The patient experiences more pain and difficulty with sitting, lifting and standing. Travel and social life are more difficult and they may be disabled from work. Personal care, sexual activity and sleeping are not grossly affected, and the patient can usually be managed by conservative means  41-60% Severe Disability Pain remains the main problem in this group, but activities of daily living are affected. These patients require a detailed investigation  61-80% Crippled Back pain impinges on all aspects of the patients life. Positive intervention is required  81-100% Bed-bound These patients are either bed-bound or exaggerating their symptoms  Bluford FORBES Zoe DELENA Karon DELENA, et al. Surgery versus conservative management of stable thoracolumbar fracture: the PRESTO feasibility RCT. [place] ([place]): [employer]; 2021 Nov. [facility] Technology Assessment, No. 25.62.) Appendix 3, Oswestry Disability Index category descriptors. Available from: Findjewelers.cz  Minimally Clinically  Important Difference (MCID) = 12.8%   Cognition WNL    Gross Musculoskeletal Assessment Tremor: None Bulk: Normal Tone: Normal No visible step-off along spinal column, no signs of scoliosis  GAIT: Distance walked: 36m   Assistive device utilized: None Level of assistance: Complete Independence Comments: WNL, Reciprocal Gait    Posture: Lumbar lordosis: WNL Iliac crest height: Equal bilaterally Lumbar lateral shift: Negative  Seated: Rounded Shoulders, Increased Thoracic Kyphosis  AROM AROM (Normal range in degrees) AROM   Lumbar   Flexion (65) 100%  Extension (30) 100%  Right lateral flexion (25) 100%  Left lateral flexion (25) 100%*  Right rotation (30) 100%  Left rotation (30)       Hip Right Left  Flexion (125) WNL WNL  Extension (15)    Abduction (40)    Adduction     Internal Rotation (45) 25 25   External Rotation (45) WNL  WNL      Knee    Flexion (135)    Extension (0)        Ankle    Dorsiflexion (20)    Plantarflexion (50)    Inversion (35)    Eversion (15)    (* = pain; Blank rows = not tested)  LE MMT: MMT (out of 5) Right  Left   Hip flexion 4- 4-  Hip extension    Hip abduction 4- 4  Hip adduction (Hooklying) 5 5  Hip internal rotation 4- 4-  Hip external rotation 4- 4-  Knee flexion 5 5  Knee extension 5 5  Ankle dorsiflexion 5 5  Ankle plantarflexion 5 5  Ankle inversion    Ankle eversion    (* = pain; Blank rows = not tested)  Sensation Grossly intact to light  touch throughout bilateral LEs as determined by testing dermatomes L2-S2. Proprioception, stereognosis, and hot/cold testing deferred on this date.  Reflexes R/L Knee Jerk (L3/4): 2+/2+    Muscle Length Hamstrings: R: Positive for tight 35 deg L: negative  Palpation Location Right Left         Lumbar paraspinals    Quadratus Lumborum    Gluteus Maximus    Gluteus Medius 1   Deep hip external rotators 1   PSIS    Fortin's Area (SIJ)    Greater Trochanter  1   (Blank rows = not tested) Graded on 0-4 scale (0 = no pain, 1 = pain, 2 = pain with wincing/grimacing/flinching, 3 = pain with withdrawal, 4 = unwilling to allow palpation)  Passive Accessory  Motion Pt denies reproduction of back pain with CPA L1-L5 and UPA bilaterally L1-L5. Generally, hypomobile throughout  Special Tests Lumbar Radiculopathy and Discogenic:  SLR (SN 92, -LR 0.29): R: Negative, pt endorsed tightness along posterior knee L:  Negative Crossed SLR (SP 90): R: Negative L: Negative  Hip: FABER (SN 81): R: Positive for glute tightness L: Negative FADIR (SN 94): R: Negative L: Negative Hip scour (SN 50): R: Negative L: Negative   Functional Tasks Deep squat: Narrow BoS, increased trunk flexion, bilateral knee pain   : 10.45 self selected = .95 m/s  5TSTS: 11.67s = (Mean: 50-59 years - 7.1s)  TODAY'S TREATMENT: DATE: 08/27/24  Subjective: Patient reports that she had a 5/10 yesterday at 6am and she got up from bed. When she laid down in bed her lower back (Left side) was hurting. At this time patient reports pain in the L lower back, upper gluteal and some restrictions along her thoracic spine. She feels as if something was restricted; felt a popping sensation yesterday with minor relief following the pop. No further questions or concerns.   Therapeutic Exercise:  Seated Thoracic Rotation (Arms around physio ball)   2 x 10 R/L dir   Seated Thoracic Sidebending (Arms around physio ball)  2 x 10 ea side   Prone Press Up   2 x 10  Cat Cow   1 x 10 reps - minor ache at thoracic spine, improved with additional repetitions  Quadruped Thread The Needle   R/L: 1 x 10 ea side - PT facilitated end range with R side   Supine Thoracic Open/Close Book   R/L: 1 x 10   Supine Glute Crossover Stretch             L: 30s/bout x 3 in order to improve muscle tension/tissue extensibility   Supine Lumbar Rotation Stretch             L: 30s/bout x 3 in order to  improve muscle tension/tissue extensibility  Manual Therapy:  Light STM applied to lumbar musculature for pain modulation and decrease muscle tension. Pt endorsed improvements in pain following intervention.   Lumbar CPA to L3-L5 segments 30s/bout x 3 bouts ea segment  Lumbar R UPA to L3-L5 segments - 30s/bout x 3 bouts ea segment  Neuromuscular Re-education (with Intent of improving TA activation and muscle contraction) :  Supine Physioball Squeeze (90-90 Position)  6 x 10s hold   Supine Bridge - Black TB around thighs   2 x 10   Standing Sidebending against KB   R/L - 1 x 10 20# KB    PATIENT EDUCATION:  Education details: Exercise Technique  Person educated: Patient Education method: Explanation, Demonstration, and Handouts Education comprehension:  verbalized understanding, returned demonstration, and tactile cues required   HOME EXERCISE PROGRAM:  Access Code: [email or web address] URL: [email or web address] Date: 08/12/2024 Prepared by: Lonni Pall  Exercises - Supine Bridge  - 1 x daily - 3-4 x weekly - 2-3 sets - 10-12 reps - Straight Leg Raise  - 1 x daily - 3-4 x weekly - 2-3 sets - 10-12 reps - Supine 90/90 leg extensions  - 1 x daily - 3-4 x weekly - 2-3 sets - 10-12 reps - Supine 90/90 Alternating Toe Touch  - 1 x daily - 7 x weekly - 3 sets - 10 reps - Cat Cow  - 2 x daily - 7 x weekly - 2-3 sets - 10 reps - Quadruped Full Range Thoracic Rotation with Reach  - 2 x daily - 7 x weekly - 2-3 sets - 10 reps - Supine Piriformis Stretch with Foot on Ground  - 2 x daily - 7 x weekly - 3 sets - 30s hold - Sidelying Hip Abduction with Resistance at Thighs  - 1 x daily - 7 x weekly - 2-3 sets - 10 reps   ASSESSMENT:  CLINICAL IMPRESSION: Continued PT POC in management of lower back pain with RLE radiating symptoms. Treated subjective symptoms with thoracolumbar mobility exercises. Patient with concordant pain during right sided UPAs; improved with additional  bouts and prone press up exercises. Targeted lumbar stability with improving TA contraction during transitional movements. Patient required multimodal cues for proper core bracing technique with good return demonstration. Encouraged patient to continue adherence to HEP if symptoms reoccur. The patient still presents with intermittent pain limiting her full participation with recreational activities and ADLs.  Based on today's performance, pt will continue to benefit from skilled PT in order to facilitate return to PLOF and improve QoL.  OBJECTIVE IMPAIRMENTS: decreased activity tolerance, decreased mobility, decreased strength, increased muscle spasms, postural dysfunction, and pain.   ACTIVITY LIMITATIONS: lifting, standing, squatting, and stairs  PARTICIPATION LIMITATIONS: community activity, occupation, and yard work  PERSONAL FACTORS: Age, Past/current experiences, Profession, and Time since onset of injury/illness/exacerbation are also affecting patient's functional outcome.   REHAB POTENTIAL: Good  CLINICAL DECISION MAKING: Evolving/moderate complexity  EVALUATION COMPLEXITY: Moderate   GOALS: Goals reviewed with patient? No  SHORT TERM GOALS: Target date: 09/02/2024  Pt will be independent with HEP in order to improve strength and decrease back pain to improve pain-free function at home and work. Baseline: Initial HEP provided  Goal status: INITIAL   LONG TERM GOALS: Target date: 09/30/2024  Pt will increase 30s STS repetitions by 5 reps in order to demonstrate improvements in LE strength and endurance for ADLs.   Baseline: 20 Reps Goal status: INITIAL  2.  Pt will decrease worst back pain by at least 2 points on the NPRS in order to demonstrate clinically significant reduction in back pain. Baseline: 4/10 Goal status: INITIAL  3.  Pt will decrease mODI score by at least 13 points in order demonstrate clinically significant reduction in back pain/disability.        Baseline: TBD; 08/08/2023: 9 / 50 = 18.0 % Goal status: INITIAL  4.  Pt will be able to squat and lift 30# KB pain free with good technique in order to demonstrate improvements in biomechanics needed for lifting and squatting during ADLs. Baseline: TBD; 08/08/2023: able to lift 10# KB with good form  Goal status: INITIAL  5. Pt will increase by at least 0.13 m/s in order to demonstrate clinically significant improvement  in community ambulation.  Baseline: 10.45 self selected = .95 m/s Goal status: INITIAL  6.  Pt will decrease 5TSTS by at least 3 seconds in order to demonstrate clinically significant improvement in LE strength Baseline: 11.67s Goal status: INITIAL  PLAN:  PT FREQUENCY: 1-2x/week  PT DURATION: 8 weeks  PLANNED INTERVENTIONS: 97164- PT Re-evaluation, 97750- Physical Performance Testing, 97110-Therapeutic exercises, 97530- Therapeutic activity, 97112- Neuromuscular re-education, 97535- Self Care, [id number]- Manual therapy, Patient/Family education, and Joint mobilization  PLAN FOR NEXT SESSION:  Progress hip strengthening, core stability, thoracolumbar mobility     Lonni Pall PT, DPT Physical Therapist- [place]  08/27/2024, 12:12 PM  "

## 2024-09-01 ENCOUNTER — Ambulatory Visit

## 2024-09-03 ENCOUNTER — Ambulatory Visit

## 2024-09-04 ENCOUNTER — Ambulatory Visit

## 2024-09-08 ENCOUNTER — Ambulatory Visit

## 2024-09-10 ENCOUNTER — Ambulatory Visit

## 2024-09-15 ENCOUNTER — Ambulatory Visit

## 2024-09-17 ENCOUNTER — Ambulatory Visit

## 2024-09-22 ENCOUNTER — Ambulatory Visit

## 2024-09-24 ENCOUNTER — Ambulatory Visit

## 2024-10-06 ENCOUNTER — Encounter: Admitting: Family
